# Patient Record
Sex: Female | Born: 1960 | Race: White | Hispanic: No | Marital: Married | Smoking: Never smoker
Health system: Southern US, Community
[De-identification: ages and names within clinical notes are randomized; demographics above are authoritative.]

## PROBLEM LIST (undated history)

## (undated) DIAGNOSIS — J45909 Unspecified asthma, uncomplicated: Secondary | ICD-10-CM

## (undated) DIAGNOSIS — T8859XA Other complications of anesthesia, initial encounter: Secondary | ICD-10-CM

## (undated) DIAGNOSIS — J189 Pneumonia, unspecified organism: Secondary | ICD-10-CM

## (undated) DIAGNOSIS — T4145XA Adverse effect of unspecified anesthetic, initial encounter: Secondary | ICD-10-CM

## (undated) DIAGNOSIS — E7800 Pure hypercholesterolemia, unspecified: Secondary | ICD-10-CM

## (undated) DIAGNOSIS — R112 Nausea with vomiting, unspecified: Secondary | ICD-10-CM

## (undated) DIAGNOSIS — Z77120 Contact with and (suspected) exposure to mold (toxic): Secondary | ICD-10-CM

## (undated) DIAGNOSIS — N809 Endometriosis, unspecified: Secondary | ICD-10-CM

## (undated) DIAGNOSIS — Z9889 Other specified postprocedural states: Secondary | ICD-10-CM

## (undated) DIAGNOSIS — M1990 Unspecified osteoarthritis, unspecified site: Secondary | ICD-10-CM

## (undated) HISTORY — PX: LAPAROSCOPIC ENDOMETRIOSIS FULGURATION: SUR769

## (undated) HISTORY — PX: CARPAL TUNNEL RELEASE: SHX101

## (undated) HISTORY — PX: APPENDECTOMY: SHX54

## (undated) HISTORY — PX: COLONOSCOPY: SHX174

---

## 1998-08-06 HISTORY — PX: ABDOMINAL HYSTERECTOMY: SHX81

## 2000-08-13 ENCOUNTER — Encounter: Payer: Self-pay | Admitting: Emergency Medicine

## 2000-08-13 ENCOUNTER — Emergency Department (HOSPITAL_COMMUNITY): Admission: EM | Admit: 2000-08-13 | Discharge: 2000-08-13 | Payer: Self-pay | Admitting: Emergency Medicine

## 2017-01-09 ENCOUNTER — Telehealth (INDEPENDENT_AMBULATORY_CARE_PROVIDER_SITE_OTHER): Payer: Self-pay | Admitting: Specialist

## 2017-01-09 NOTE — Telephone Encounter (Signed)
Patient called and asked if we would be able to pull up xrays taken in Oct 2017 at [facility]? CB # [id number]

## 2017-01-09 NOTE — Telephone Encounter (Signed)
I called and advised that we cannot get images or the reports due to them being read nby Dr in a clinic, however they are over 63 months old so we will probably get new ones at her office visit, due to needing additional images too.

## 2017-01-14 ENCOUNTER — Ambulatory Visit (INDEPENDENT_AMBULATORY_CARE_PROVIDER_SITE_OTHER): Payer: PRIVATE HEALTH INSURANCE | Admitting: Specialist

## 2017-01-14 ENCOUNTER — Ambulatory Visit (INDEPENDENT_AMBULATORY_CARE_PROVIDER_SITE_OTHER): Payer: PRIVATE HEALTH INSURANCE

## 2017-01-14 ENCOUNTER — Encounter (INDEPENDENT_AMBULATORY_CARE_PROVIDER_SITE_OTHER): Payer: Self-pay | Admitting: Specialist

## 2017-01-14 VITALS — BP 158/90 | HR 84 | Ht 65.0 in | Wt 138.0 lb

## 2017-01-14 DIAGNOSIS — M4722 Other spondylosis with radiculopathy, cervical region: Secondary | ICD-10-CM | POA: Diagnosis not present

## 2017-01-14 DIAGNOSIS — M5412 Radiculopathy, cervical region: Secondary | ICD-10-CM | POA: Diagnosis not present

## 2017-01-14 DIAGNOSIS — M542 Cervicalgia: Secondary | ICD-10-CM

## 2017-01-14 MED ORDER — GABAPENTIN 100 MG PO CAPS: 100.0000 mg | ORAL_CAPSULE | Freq: Every day | ORAL | 0 refills | Status: DC

## 2017-01-14 MED ORDER — DICLOFENAC POTASSIUM 50 MG PO TABS: 50.0000 mg | ORAL_TABLET | Freq: Two times a day (BID) | ORAL | 1 refills | Status: DC

## 2017-01-14 NOTE — Patient Instructions (Signed)
Avoid overhead lifting and overhead use of the arms. Do not lift greater than 5-10 lbs. Adjust head rest in vehicle to prevent hyperextension if rear ended. Hot showers or ice as needed. Try diclofenac one tablet a day or twice a day.

## 2017-01-14 NOTE — Progress Notes (Addendum)
Office Visit Note   Patient: Marie Richardson           Date of Birth: [birth year]           MRN: [id number] Visit Date: 01/14/2017              Requested by: No referring provider defined for this encounter. PCP: System, Pcp Not In   Assessment & Plan: Visit Diagnoses:  1. Cervicalgia   2. Other spondylosis with radiculopathy, cervical region   3. 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 She was seen by [facility]  In September 2017. Her clinical exam shows decreased ROM of the cervical spine with pain with flexion or extension. She has weakness in the right triceps 4/5 and right wrist volar flexors 4/5, intrinsic motor is intact. She complains of clumbsiness with the right hand and dropping of items. No balance or coordination difficulties, no bowel or bladder abnormalities.  Recommend for cervical MRI as the pattern of weakness and numbness is most consistent with C7 or C8  Radiculopathy. Her studies also show diffuse arthrosis changes of the facets of the cervical spine C2 through C6 and at the C1-2 level, no instability and the disc height is well maintained.   Plan: Avoid overhead lifting and overhead use of the arms. Do not lift greater than 5-10 lbs. Adjust head rest in vehicle to prevent hyperextension if rear ended. Hot showers or ice as needed. Try diclofenac one tablet a day or twice a day. MRI of the cervical spine is ordered. Try gabapentin for neurogenic pain at night. Diclofenac for ll Follow-Up Instructions: Return in about 3 weeks (around 02/04/2017).   Orders:  Orders Placed This Encounter  Procedures  . XR Cervical Spine 2 or 3 views  . MR Cervical Spine w/o contrast   Meds ordered this encounter    Medications  . gabapentin (NEURONTIN) 100 MG capsule    Sig: Take 1 capsule (100 mg total) by mouth at bedtime.    Dispense:  30 capsule    Refill:  0  . diclofenac (CATAFLAM) 50 MG tablet    Sig: Take 1 tablet (50 mg total) by mouth 2 (two) times daily.    Dispense:  60 tablet    Refill:  1      Procedures: No procedures performed   Clinical Data: No additional findings.   Subjective: Chief Complaint  Patient presents with  . Neck - Pain    56 year old right handed female with greater one year history of neck pain with pain between her shoulder blades. Pain began when shovelling snow in driveway in January, 2017. Pain with soreness originally, then it worsened over time and was seen by [facility]earing or [facility]. They thought she may wish to consider surgery but she is reluctant to consider having the surgery in [place]. Pain on scale of 1-10 is on the higher end. She has been taking ibuprofen, stopped to let the pain be present for exam today but had to take ibuprofen starting Sunday.  She takes 800 mg motrin daily, also has muscle relaxers. No problems with standing or walking. Has noticed dropping intems with the right greater than left. She is having some difficulty with hand writitng. At the end  of the day she notices tightness in the muscles of the hands. Both hands with numbness in the AM and also during the day and with driving. Awakes at night and not sleeping as well as she used to. Neck in the past was aching, intermittant and now it is more constant. Bending backwards or when driving more pain with ROM. With computer staring and having to use her phone. 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 Last 2 years has lost her exercise capacity and now is easily worn out. Has been 4 years at a part time work. 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    Review of Systems  Constitutional: Negative.   HENT: Negative.   Eyes: Negative.   Respiratory: Negative.   Cardiovascular: Negative.   Gastrointestinal: Negative.   Endocrine: Negative.   Genitourinary: Negative.   Musculoskeletal: Negative.   Skin: Negative.   Allergic/Immunologic: Negative.   Neurological: Negative.   Hematological: Negative.   Psychiatric/Behavioral: Negative.      Objective: Vital Signs: BP (!) 158/90 (BP Location: Left Arm, Patient Position: Sitting)   Pulse 84   Ht 5\' 5"  (1.651 m)   Wt 138 lb (62.6 kg)   BMI 22.96 kg/m   Physical Exam  Constitutional: She is oriented to person, place, and time. She appears well-developed and well-nourished.  HENT:  Head: Normocephalic and atraumatic.  Eyes: EOM are normal. Pupils are equal, round, and reactive to light.  Neck: Normal range of motion. Neck supple.  Pulmonary/Chest: Effort normal and breath sounds normal.  Abdominal: Soft. Bowel sounds are normal.  Neurological: She is alert and oriented to person, place, and time.  Skin: Skin is warm and dry.  Psychiatric: She has a normal mood and affect. Her behavior is normal. Judgment and thought content normal.    Back Exam   Tenderness  The patient is experiencing tenderness in the cervical.  Range of Motion  Extension: 80  Flexion: 90  Lateral Bend Right:  80 abnormal  Lateral Bend Left:  80 abnormal  Rotation Right: 80  Rotation Left: 80   Muscle Strength  Right Quadriceps:  4/5  Left Quadriceps:  4/5  Right Hamstrings:  4/5  Left Hamstrings:  4/5   Other  Toe Walk: normal Heel Walk: normal Gait: normal       Specialty Comments:  No specialty comments available.  Imaging: Xr Cervical Spine 2 Or 3 Views  Result Date: 01/14/2017 AP and lateral flexion and extension of the cervical spine with disc height well maintained, there is spondylosis of the cervical segments C2-3 through C6, mild DJD at the  C1-2    PMFS History: There are no active problems to display for this patient.  No past medical history on file.  No family history on file.  No past surgical history on file. Social History   Occupational History  . Not on file.   Social History Main Topics  . Smoking status: Never Smoker  . Smokeless tobacco: Never Used  . Alcohol use Yes  . Drug use: No  . Sexual activity: Not on file

## 2017-01-24 ENCOUNTER — Telehealth (INDEPENDENT_AMBULATORY_CARE_PROVIDER_SITE_OTHER): Payer: Self-pay | Admitting: Specialist

## 2017-01-24 NOTE — Telephone Encounter (Signed)
PT ASKED IF WE CAN SEND IN SOMETHING PRIOR  TO MRI SAT FOR CLAUSTROPHOBIA PLEASE.  [phone]

## 2017-01-24 NOTE — Telephone Encounter (Signed)
PT ASKED IF WE CAN SEND IN SOMETHING PRIOR  TO MRI SAT FOR CLAUSTROPHOBIA PLEASE

## 2017-01-25 ENCOUNTER — Telehealth (INDEPENDENT_AMBULATORY_CARE_PROVIDER_SITE_OTHER): Payer: Self-pay | Admitting: Orthopedic Surgery

## 2017-01-25 MED ORDER — DIAZEPAM 5 MG PO TABS: ORAL_TABLET | ORAL | 0 refills | Status: DC

## 2017-01-25 NOTE — Telephone Encounter (Signed)
I called in #2 valium 5mg  to pharmacy. Patients MRI is Saturday 06/23 She will have driver

## 2017-01-25 NOTE — Telephone Encounter (Signed)
PT ASKED IF WE CAN SEND IN SOMETHING PRIOR TO MRI SAT FOR CLAUSTROPHOBIA PLEASE

## 2017-01-26 ENCOUNTER — Ambulatory Visit
Admission: RE | Admit: 2017-01-26 | Discharge: 2017-01-26 | Disposition: A | Discharge status: Home / Self Care | Payer: Self-pay | Source: Ambulatory Visit | Attending: Specialist | Admitting: Specialist

## 2017-01-26 DIAGNOSIS — M5412 Radiculopathy, cervical region: Secondary | ICD-10-CM

## 2017-01-26 DIAGNOSIS — M4722 Other spondylosis with radiculopathy, cervical region: Secondary | ICD-10-CM

## 2017-01-28 ENCOUNTER — Telehealth (INDEPENDENT_AMBULATORY_CARE_PROVIDER_SITE_OTHER): Payer: Self-pay | Admitting: Radiology

## 2017-01-28 NOTE — Telephone Encounter (Signed)
Patient called in stated is having increased pain since MRI, wanted to be worked in sooner with Dr. Louanne Skye.  She did not want to see another physician at this time.  Pain is tolerable, does not want narcotics.  Patient wants advise if there is any other medication help with pain until scheduled appointment with Dr. Louanne Skye.

## 2017-01-28 NOTE — Telephone Encounter (Signed)
Patient called in stated is having increased pain since MRI, wanted to be worked in sooner with Dr. Louanne Skye.  She did not want to see another physician at this time.  Pain is tolerable, does not want narcotics.  Patient wants advise if there is any other medication help with pain until scheduled appointment with Dr. Marion Downer advise and I will call her

## 2017-01-28 NOTE — Telephone Encounter (Signed)
ok 

## 2017-01-30 NOTE — Progress Notes (Signed)
Patient sched for 7// to review scan

## 2017-01-30 NOTE — Telephone Encounter (Signed)
I called Marie Richardson and spoke with her, she is still experiencing discomfort in her neck, gabapentin helps some at night. I told her to increase the diclofenac to 50 mg TID from the BID and the gabapentin to 200mg  at night. She has an appointment for Monday 7/2 for follow up. I told her the results of the MRI.

## 2017-02-04 ENCOUNTER — Ambulatory Visit (INDEPENDENT_AMBULATORY_CARE_PROVIDER_SITE_OTHER): Payer: PRIVATE HEALTH INSURANCE | Admitting: Specialist

## 2017-02-04 ENCOUNTER — Encounter (INDEPENDENT_AMBULATORY_CARE_PROVIDER_SITE_OTHER): Payer: Self-pay | Admitting: Specialist

## 2017-02-04 VITALS — BP 145/87 | HR 90 | Ht 65.0 in | Wt 138.0 lb

## 2017-02-04 DIAGNOSIS — M4722 Other spondylosis with radiculopathy, cervical region: Secondary | ICD-10-CM | POA: Diagnosis not present

## 2017-02-04 MED ORDER — TRAMADOL-ACETAMINOPHEN 37.5-325 MG PO TABS: 1.0000 | ORAL_TABLET | Freq: Four times a day (QID) | ORAL | 0 refills | Status: DC | PRN

## 2017-02-04 NOTE — Progress Notes (Addendum)
Office Visit Note   Patient: Marie Richardson           Date of Birth: [birth year]           MRN: [id number] Visit Date: 02/04/2017              Requested by: No referring provider defined for this encounter. PCP: System, Pcp Not In   Assessment & Plan: Visit Diagnoses:  1. Other spondylosis with radiculopathy, cervical region   56 year old right handed female with persistent radicular pain into the right shoulder and right forearm for over one year post digging out of  [place]. Her pain is consistent with a cervical radiculopathy with limitation of ROM of the cervical spine and plain radiographs and cervical MRI  Consistent with cervical spondylosis. Her motor deficit does not quite match her radicular pain or the C6 right sided findings on her MRI. I recommend A selective nerve block right C6 with lidocaine and no steroid to assess if the C6 root is the source of the radicular symptoms and potentially the cause for the triceps weakness if there is a pre or post fixed root level. Schedule the SNR block and return visit in 2 weeks to decide is surgical intervention with a right C5-6 foraminotomy with C6 nerve root decompression can be considered.    Plan: Avoid overhead lifting and overhead use of the arms. Do not lift greater than 5 lbs. Adjust head rest in vehicle to prevent hyperextension if rear ended. A right C6 nerve root block is ordered to establish this as the pain generator in your neck as you have findings of arthritis in most of your  Cervical spine, C3-4, C4-5, C5-6 and C6-7.   Follow-Up Instructions: Return in about 2 weeks (around 02/18/2017).   Orders:  Orders Placed This Encounter  Procedures  . Ambulatory referral to Interventional Radiology   Meds ordered this encounter  Medications  . traMADol-acetaminophen (ULTRACET) 37.5-325 MG tablet    Sig: Take 1 tablet by mouth every 6 (six) hours as needed.    Dispense:  30 tablet    Refill:  0      Procedures: No  procedures performed   Clinical Data: Findings:  MRI with facet arthrosis changes C3-4, C4-5, C5-6 and C6-7. More significant on the left side at C3-4 and C4-5. 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 Minimal anterolisthesis C5-6.     Subjective: Chief Complaint  Patient presents with  . Neck - Follow-up    MRI Review----CSP    56 year old female with a greater than one year history of neck pain and radiation into the right shoulder, right lateral arm and dorsolateral forearm. She experiences pain and burning qualities as well as numbness and paresthesias into the right arm and forearm. Seen and evaluated found to have weakness in the right triceps and radiographic signs of spondylosis of the cervical spine, discs are well maintained. Underwent recent MRI which demonstrates right sided changes at the C5-6 level with severe spondylosis of the right C5-6 facet and severe foramenal narrowing of the right C5-6 foramen due to spondylosis of the facet and mild uncovertebral changes. No gait disturbance, no bowel or bladder difficulties. She reports that she is ready to have something done to stop the pain.     Review of Systems  Constitutional: Negative.   HENT: Negative.   Eyes: Negative.   Respiratory: Negative.  Cardiovascular: Negative.   Gastrointestinal: Negative.   Endocrine: Negative.   Genitourinary: Negative.   Musculoskeletal: Negative.   Skin: Negative.   Allergic/Immunologic: Negative.   Neurological: Negative.   Hematological: Negative.   Psychiatric/Behavioral: Negative.      Objective: Vital Signs: BP (!) 145/87 (BP Location: Left Arm, Patient Position: Sitting)   Pulse 90   Ht 5\' 5"  (1.651 m)   Wt 138 lb (62.6 kg)   BMI 22.96 kg/m   Physical Exam  Constitutional: She is oriented to person, place, and time. She appears well-developed and well-nourished.  HENT:  Head:  Normocephalic and atraumatic.  Eyes: EOM are normal. Pupils are equal, round, and reactive to light.  Neck: Normal range of motion. Neck supple.  Pulmonary/Chest: Effort normal and breath sounds normal.  Abdominal: Soft. Bowel sounds are normal.  Neurological: She is alert and oriented to person, place, and time.  Skin: Skin is warm and dry.  Psychiatric: She has a normal mood and affect. Her behavior is normal. Judgment and thought content normal.    Back Exam   Tenderness  The patient is experiencing tenderness in the cervical.  Range of Motion  Extension:  60 abnormal  Flexion: 70  Lateral Bend Right: 60  Lateral Bend Left: 70  Rotation Right: 60  Rotation Left: 70   Muscle Strength  Right Quadriceps:  5/5  Left Quadriceps:  5/5  Right Hamstrings:  5/5  Left Hamstrings:  5/5   Tests  Straight leg raise right: negative Straight leg raise left: negative  Reflexes  Patellar: normal Achilles: normal Biceps: abnormal Babinski's sign: normal   Other  Toe Walk: normal Heel Walk: normal Gait: normal   Comments:  Weakness right triceps 4/5, shoulder ROM is normal.       Specialty Comments:  No specialty comments available.  Imaging: No results found.   PMFS History: There are no active problems to display for this patient.  No past medical history on file.  No family history on file.  No past surgical history on file. Social History   Occupational History  . Not on file.   Social History Main Topics  . Smoking status: Never Smoker  . Smokeless tobacco: Never Used  . Alcohol use Yes  . Drug use: No  . Sexual activity: Not on file

## 2017-02-04 NOTE — Addendum Note (Signed)
Addended by: Basil Dess on: 02/04/2017 04:34 PM   Modules accepted: Orders

## 2017-02-04 NOTE — Patient Instructions (Signed)
Plan: Avoid overhead lifting and overhead use of the arms. Do not lift greater than 5 lbs. Adjust head rest in vehicle to prevent hyperextension if rear ended. A right C6 nerve root block is ordered to establish this as the pain generator in your neck as you have findings of arthritis in most of your  Cervical spine, C3-4, C4-5, C5-6 and C6-7.

## 2017-02-05 ENCOUNTER — Telehealth (INDEPENDENT_AMBULATORY_CARE_PROVIDER_SITE_OTHER): Payer: Self-pay

## 2017-02-05 NOTE — Telephone Encounter (Signed)
Patient called stating she saw Dr Louanne Skye yesterday and he wanted to refer her to Dr Vernard Gambles to have nerve block. She had not heard anything so she called his office and they advised her that he does not do these. She stated she needs to be sent somewhere else to have this done. I advised would likely have to discuss with Dr Louanne Skye to see if he knows of somewhere to refer. She is anxious to have this done because she is leaving for vacation on Saturday. Please call her to discuss [phone]

## 2017-02-08 NOTE — Telephone Encounter (Signed)
Can you check on this for me? He was going to send her to [facility] to have injection done.

## 2017-02-11 NOTE — Telephone Encounter (Signed)
I called and left message with Anderson Malta at [facility] advising of order to see if can do. IF can she will contact pt to schedule appt

## 2017-02-21 ENCOUNTER — Ambulatory Visit (INDEPENDENT_AMBULATORY_CARE_PROVIDER_SITE_OTHER): Payer: PRIVATE HEALTH INSURANCE | Admitting: Specialist

## 2017-03-06 ENCOUNTER — Encounter (INDEPENDENT_AMBULATORY_CARE_PROVIDER_SITE_OTHER): Payer: Self-pay | Admitting: Physical Medicine and Rehabilitation

## 2017-03-06 ENCOUNTER — Other Ambulatory Visit (INDEPENDENT_AMBULATORY_CARE_PROVIDER_SITE_OTHER): Payer: Self-pay | Admitting: Radiology

## 2017-03-06 ENCOUNTER — Ambulatory Visit (INDEPENDENT_AMBULATORY_CARE_PROVIDER_SITE_OTHER): Payer: Self-pay

## 2017-03-06 ENCOUNTER — Ambulatory Visit (INDEPENDENT_AMBULATORY_CARE_PROVIDER_SITE_OTHER): Payer: PRIVATE HEALTH INSURANCE | Admitting: Physical Medicine and Rehabilitation

## 2017-03-06 VITALS — BP 183/93 | HR 79

## 2017-03-06 DIAGNOSIS — M5412 Radiculopathy, cervical region: Secondary | ICD-10-CM | POA: Diagnosis not present

## 2017-03-06 DIAGNOSIS — M4722 Other spondylosis with radiculopathy, cervical region: Secondary | ICD-10-CM

## 2017-03-06 MED ORDER — LIDOCAINE HCL (PF) 1 % IJ SOLN: 2.0000 mL | Freq: Once | INTRAMUSCULAR | Status: DC

## 2017-03-06 NOTE — Progress Notes (Deleted)
Neck and right shoulder pain for over 2 years. Gotten worse over time. Seems to be worse at the end of the day once she relaxes. Numbness and weakness in arm along with tightness at times.

## 2017-03-06 NOTE — Telephone Encounter (Signed)
Patient states that she is not resting with the lyrica and she would like to go back on the gabapentin as she feels like she did get more rest on that.

## 2017-03-06 NOTE — Progress Notes (Unsigned)
Fluoro Time: 48 sec MGy: 9.09

## 2017-03-06 NOTE — Patient Instructions (Signed)

## 2017-03-07 NOTE — Procedures (Signed)
Mrs. 2 word is a 56 year old right-hand dominant female with right neck shoulder and arm pain with noted numbness and weakness. She also feels like her arm gets tight at times. She seems to be worse at the end of the day once she actually relaxes it seems to get worse. Has been ongoing for 2 years of failing conservative care otherwise. Dr. Louanne Skye did obtain an MRI scan showing significant facet arthritis and some foraminal narrowing at C5-6. We are going to complete a diagnostic C6 selective nerve root block. Cervical Transforaminal Selective Nerve Root Block with Fluoroscopic Guidance   Patient: Marie Richardson      Date of Birth: 1960-09-22 MRN: [id number] PCP: System, Pcp Not In      Visit Date: 03/06/2017   Universal Protocol:    Date/Time: 03/07/1810:20 AM  Consent Given By: the patient  Position: supine  Additional Comments: Vital signs were monitored before and after the procedure. Patient was prepped and draped in the usual sterile fashion. The correct patient, procedure, and site was verified.   Injection Procedure Details:  Procedure Site One Meds Administered:  Meds ordered this encounter  Medications  . lidocaine (PF) (XYLOCAINE) 1 % injection 2 mL     Laterality: Right  Location/Site:  C5-6  Needle size: 25 G  Needle type: spinal needle  Needle Placement: under the pedicle  Findings:  -Contrast Used: 1 mL iohexol 180 mg iodine/mL   -Comments: Excellent flow of contrast along the nerve and nerve root without intravascular flow. *Patient did seem to have some initial pain relief of the neck and shoulder pain but the arm pain was still present.  Procedure Details: The C-arm was obliqued to the ipsilateral side of the patient to about 45 from AP.  The C-arm was then tilted to square off the inferior endplate of the level targeted.  C-arm obliquity was then adjusted to maximize the size of the foramen.  The skin over the targeted area which was the posterior  inferior portion of the foramen was then anesthetized with a small amount of 1% lidocaine without epinephrine.  A 25-gauge 2-1/2 inch spinal needle was then introduced through the skin and down to the targeted area under biplanar fluoroscopic guidance.  Multiple AP and lateral/oblique views were monitored during needle manipulation.  Final placement was with the needle just outside of the 6 o'clock position on the AP view.  After negative aspirate for air, blood, and CSF, a 1 ml. volume of Isovue-250 was injected into the foramen and the flow of contrast was observed. Radiographs were obtained for documentation purposes.   When there was no arterial or venous flow of contrast, the injectate was administered into the level noted above.     Additional Comments:  The patient tolerated the procedure well Dressing: Band-Aid    Post-procedure details: Patient was observed during the procedure. Post-procedure instructions were reviewed.  Patient left the clinic in stable condition.

## 2017-03-08 ENCOUNTER — Other Ambulatory Visit (INDEPENDENT_AMBULATORY_CARE_PROVIDER_SITE_OTHER): Payer: Self-pay | Admitting: Specialist

## 2017-03-08 DIAGNOSIS — M5412 Radiculopathy, cervical region: Secondary | ICD-10-CM

## 2017-03-08 MED ORDER — TRAMADOL-ACETAMINOPHEN 37.5-325 MG PO TABS: 1.0000 | ORAL_TABLET | Freq: Four times a day (QID) | ORAL | 0 refills | Status: DC | PRN

## 2017-03-08 NOTE — Telephone Encounter (Signed)
Patient called and stated that she also requested the Tramadol for the pain. Thank you.

## 2017-03-11 ENCOUNTER — Encounter (INDEPENDENT_AMBULATORY_CARE_PROVIDER_SITE_OTHER): Payer: PRIVATE HEALTH INSURANCE | Admitting: Physical Medicine and Rehabilitation

## 2017-03-11 NOTE — Telephone Encounter (Signed)
gabapentin refill request 

## 2017-03-11 NOTE — Telephone Encounter (Signed)
Called to [facility] to advise patient

## 2017-03-12 ENCOUNTER — Ambulatory Visit (INDEPENDENT_AMBULATORY_CARE_PROVIDER_SITE_OTHER): Payer: PRIVATE HEALTH INSURANCE | Admitting: Specialist

## 2017-03-12 ENCOUNTER — Encounter (INDEPENDENT_AMBULATORY_CARE_PROVIDER_SITE_OTHER): Payer: Self-pay | Admitting: Specialist

## 2017-03-12 VITALS — BP 133/86 | HR 79 | Ht 65.0 in | Wt 138.0 lb

## 2017-03-12 DIAGNOSIS — M4722 Other spondylosis with radiculopathy, cervical region: Secondary | ICD-10-CM | POA: Diagnosis not present

## 2017-03-12 DIAGNOSIS — M5412 Radiculopathy, cervical region: Secondary | ICD-10-CM | POA: Diagnosis not present

## 2017-03-12 NOTE — Patient Instructions (Addendum)
Avoid overhead lifting and overhead use of the arms. Do not lift greater than 5 lbs. Adjust head rest in vehicle to prevent hyperextension if rear ended. Take extra precautions to avoid falling, including use of a cane if you feel weak. Scheduling secretary Kandice Hams will call you to arrange for surgery for your cervical spine. If you wish a second opinion please let us know and we can arrange for you. If you have worsening arm or leg numbness or weakness please call or go to an ER.  Surgery will be a posterior cervical foramenotomies at the C5-6 and C6-7 levels with Risks of surgery include risks of infection, bleeding and risks to the spinal cord and  Surgery is indicated due to upper extremity radiculopathy. In the future surgery at adjacent levels may be necessary but these levels do not appear to be related to your current symptoms or signs.

## 2017-03-12 NOTE — Progress Notes (Addendum)
Office Visit Note   Patient: Marie Richardson           Date of Birth: 26-Sep-1960           MRN: [id number] Visit Date: 03/12/2017              Requested by: No referring provider defined for this encounter. PCP: System, Pcp Not In   Assessment & Plan: Visit Diagnoses:  1. Other spondylosis with radiculopathy, cervical region   56 year old right handed female, nearly 2 year history of neck pain and radiation into the right arm. Studies with spondylosis changes right C3-4, C4-5, C5-6 and C6-7. Recent C6 selective nerve root block with relief of neck and right shoulder pain for 2-3 hours. "I told my husband to get home  Quick because my neck felt soo good I wanted to get some things done. The medication wore off and pain has recurred. Also had some persistent pain into the top of the right hand that was not improved with the C6 block. She has tried conservative treatment, her weakness in the right hand is  C7 distirbution, finger extension and wrist volar flexion. I recommend a right sided C5-6 and C6-7 foramenotomies based on clincal finding of a C7 Weakness and pain relief in the arm and neck with C6 block. Only other consideration would be fusion surgery which I do not recommend as she Would require a potentially 4 level fusion for degenerative changes. Expect this would relieve her arm pain but likely she will have some persistent Right sided neck pain. The surgery does not remove arthritis but relieves nerve compression. She understands and wishes to proceed.  Expect a minimum of 2 weeks to recover to where she may consider return to work but potentially as long as 6 weeks for muscle recovery.Return to work dependent on levels of discomfort.    Plan:Avoid overhead lifting and overhead use of the arms. Do not lift greater than 5 lbs. Adjust head rest in vehicle to prevent hyperextension if rear ended. Take extra precautions to avoid falling, including use of a cane if you feel  weak. Scheduling secretary Kandice Hams will call you to arrange for surgery for your cervical spine. If you wish a second opinion please let us know and we can arrange for you. If you have worsening arm or leg numbness or weakness please call or go to an ER.  Surgery will be a posterior cervical foramenotomies at the C5-6 and C6-7 levels with Risks of surgery include risks of infection, bleeding and risks to the spinal cord and  Surgery is indicated due to upper extremity radiculopathy. In the future surgery at adjacent levels may be necessary but these levels do not appear to be related to your current symptoms or signs.   Follow-Up Instructions: Return in about 4 weeks (around 04/09/2017) for post op appointment.   Orders:  No orders of the defined types were placed in this encounter.  No orders of the defined types were placed in this encounter.     Procedures: No procedures performed   Clinical Data: No additional findings.   Subjective: Chief Complaint  Patient presents with  . Neck - Follow-up    56 year old right handed female with 18 month history of neck pain and radiation into the right arm and right hand. Pain worse with extension and rotation of the neck. Has undergone PT and has seen no improvement with steroids or NSAIDs. Recent selective nerve root block right  C6 with improved right shoulder and right neck pain lasting only about 2-3 hours. No bowel or bladder difficulties, no gait disturbance. Still with decreased grip strength in the right hand, noted with inability to hold onto the lease while walking the dog.    Review of Systems  Constitutional: Negative.   HENT: Negative.   Eyes: Negative.   Respiratory: Negative.   Cardiovascular: Negative.   Gastrointestinal: Negative.   Endocrine: Negative.   Genitourinary: Negative.   Musculoskeletal: Negative.   Skin: Negative.   Allergic/Immunologic: Negative.   Neurological: Negative.   Hematological:  Negative.   Psychiatric/Behavioral: Negative.      Objective: Vital Signs: BP 133/86 (BP Location: Left Arm, Patient Position: Sitting, Cuff Size: Normal)   Pulse 79   Ht 5\' 5"  (1.651 m)   Wt 138 lb (62.6 kg)   BMI 22.96 kg/m   Physical Exam  Constitutional: She is oriented to person, place, and time. She appears well-developed and well-nourished.  HENT:  Head: Normocephalic and atraumatic.  Eyes: Pupils are equal, round, and reactive to light. EOM are normal.  Neck: Normal range of motion. Neck supple.  Pulmonary/Chest: Effort normal and breath sounds normal.  Abdominal: Soft. Bowel sounds are normal.  Neurological: She is alert and oriented to person, place, and time.  Skin: Skin is warm and dry.  Psychiatric: She has a normal mood and affect. Her behavior is normal. Judgment and thought content normal.    Back Exam   Tenderness  The patient is experiencing tenderness in the cervical.  Range of Motion  Extension: abnormal  Flexion: normal  Lateral Bend Right: abnormal  Lateral Bend Left: normal  Rotation Right: abnormal  Rotation Left: normal   Muscle Strength  Right Quadriceps:  5/5  Left Quadriceps:  5/5  Right Hamstrings:  5/5  Left Hamstrings:  5/5   Tests  Straight leg raise right: negative Straight leg raise left: negative  Reflexes  Patellar: 2/4 Achilles: 2/4 Biceps:  Hyporeflexic abnormal Babinski's sign: normal   Other  Toe Walk: normal Heel Walk: normal  Comments:  Positive spurling right side.      Specialty Comments:  No specialty comments available.  Imaging: No results found.   PMFS History: There are no active problems to display for this patient.  No past medical history on file.  No family history on file.  No past surgical history on file. Social History   Occupational History  . Not on file.   Social History Main Topics  . Smoking status: Never Smoker  . Smokeless tobacco: Never Used  . Alcohol use Yes  .  Drug use: No  . Sexual activity: Not on file

## 2017-03-22 ENCOUNTER — Encounter (HOSPITAL_COMMUNITY): Payer: Self-pay

## 2017-03-22 ENCOUNTER — Encounter (HOSPITAL_COMMUNITY)
Admission: RE | Admit: 2017-03-22 | Discharge: 2017-03-22 | Disposition: A | Discharge status: Home / Self Care | Payer: PRIVATE HEALTH INSURANCE | Source: Ambulatory Visit | Attending: Specialist | Admitting: Specialist

## 2017-03-22 DIAGNOSIS — Z01810 Encounter for preprocedural cardiovascular examination: Secondary | ICD-10-CM | POA: Insufficient documentation

## 2017-03-22 DIAGNOSIS — Z01812 Encounter for preprocedural laboratory examination: Secondary | ICD-10-CM | POA: Diagnosis present

## 2017-03-22 DIAGNOSIS — M4802 Spinal stenosis, cervical region: Secondary | ICD-10-CM | POA: Diagnosis not present

## 2017-03-22 DIAGNOSIS — M50222 Other cervical disc displacement at C5-C6 level: Secondary | ICD-10-CM | POA: Insufficient documentation

## 2017-03-22 HISTORY — DX: Endometriosis, unspecified: N80.9

## 2017-03-22 HISTORY — DX: Unspecified asthma, uncomplicated: J45.909

## 2017-03-22 HISTORY — DX: Other complications of anesthesia, initial encounter: T88.59XA

## 2017-03-22 HISTORY — DX: Contact with and (suspected) exposure to mold (toxic): Z77.120

## 2017-03-22 HISTORY — DX: Adverse effect of unspecified anesthetic, initial encounter: T41.45XA

## 2017-03-22 LAB — COMPREHENSIVE METABOLIC PANEL
ALBUMIN: 4.6 g/dL (ref 3.5–5.0)
ALT: 29 U/L (ref 14–54)
ANION GAP: 8 (ref 5–15)
AST: 26 U/L (ref 15–41)
Alkaline Phosphatase: 62 U/L (ref 38–126)
BUN: 7 mg/dL (ref 6–20)
CALCIUM: 9.6 mg/dL (ref 8.9–10.3)
CHLORIDE: 104 mmol/L (ref 101–111)
CO2: 27 mmol/L (ref 22–32)
Creatinine, Ser: 0.6 mg/dL (ref 0.44–1.00)
GFR calc non Af Amer: >60 mL/min (ref >60)
Glucose, Bld: 126 mg/dL — ABNORMAL HIGH (ref 65–99)
POTASSIUM: 3.9 mmol/L (ref 3.5–5.1)
SODIUM: 139 mmol/L (ref 135–145)
Total Bilirubin: 0.6 mg/dL (ref 0.3–1.2)
Total Protein: 7.5 g/dL (ref 6.5–8.1)

## 2017-03-22 LAB — CBC
HCT: 37.3 % (ref 36.0–46.0)
Hemoglobin: 12.7 g/dL (ref 12.0–15.0)
MCH: 31.8 pg (ref 26.0–34.0)
MCHC: 34 g/dL (ref 30.0–36.0)
MCV: 93.5 fL (ref 78.0–100.0)
PLATELETS: 318 10*3/uL (ref 150–400)
RBC: 3.99 MIL/uL (ref 3.87–5.11)
RDW: 11.9 % (ref 11.5–15.5)
WBC: 8.9 10*3/uL (ref 4.0–10.5)

## 2017-03-22 LAB — PROTIME-INR
INR: 0.91
Prothrombin Time: 12.3 seconds (ref 11.4–15.2)

## 2017-03-22 LAB — APTT: APTT: 27 s (ref 24–36)

## 2017-03-22 LAB — SURGICAL PCR SCREEN
MRSA, PCR: NEGATIVE
STAPHYLOCOCCUS AUREUS: NEGATIVE

## 2017-03-22 NOTE — Progress Notes (Addendum)
PCP: Dr. Luana Shu ([place])  Cardiologist: pt denies  EKG: pt denies past year  Stress test: pt denies ever  ECHO:pt denies ever  Cardiac Cath:pt denies ever  Chest x-ray: pt denies past year

## 2017-03-22 NOTE — Pre-Procedure Instructions (Signed)
Marie Richardson  03/22/2017      [facility] [place], [place] - [place], [id number], [address] [place] [address] Phone: [phone] Fax: [phone]    Your procedure is scheduled on March 26, 2017.  Report to [facility] Admitting at 800 AM.  Call this number if you have problems the morning of surgery:  [phone]   Remember:  Do not eat food or drink liquids after midnight.  Take these medicines the morning of surgery with A SIP OF WATER albuterol inhaler (if needed, bring inhaler with you), estradiol (estrace), gabapentin (neurontin), tramadol-acetaminophen (ultracet)-if needed for pain.   7 days prior to surgery STOP taking any diclofenac (cataflam) Aspirin, Aleve, Naproxen, Ibuprofen, Motrin, Advil, Goody's, BC's, all herbal medications, fish oil, and all vitamins   Do not wear jewelry, make-up or nail polish.  Do not wear lotions, powders, or perfumes, or deoderant.  Do not shave 48 hours prior to surgery.    Do not bring valuables to the hospital.  [facility] is not responsible for any belongings or valuables.  Contacts, dentures or bridgework may not be worn into surgery.  Leave your suitcase in the car.  After surgery it may be brought to your room.  For patients admitted to the hospital, discharge time will be determined by your treatment team.  Patients discharged the day of surgery will not be allowed to drive home.   Special instructions:   [facility]- Preparing For Surgery  Before surgery, you can play an important role. Because skin is not sterile, your skin needs to be as free of germs as possible. You can reduce the number of germs on your skin by washing with CHG (chlorahexidine gluconate) Soap before surgery.  CHG is an antiseptic cleaner which kills germs and bonds with the skin to continue killing germs even after washing.  Please do not use if you have an allergy to CHG or antibacterial soaps.  If your skin becomes reddened/irritated stop using the CHG.  Do not shave (including legs and underarms) for at least 48 hours prior to first CHG shower. It is OK to shave your face.  Please follow these instructions carefully.   1. Shower the NIGHT BEFORE SURGERY and the MORNING OF SURGERY with CHG.   2. If you chose to wash your hair, wash your hair first as usual with your normal shampoo.  3. After you shampoo, rinse your hair and body thoroughly to remove the shampoo.  4. Use CHG as you would any other liquid soap. You can apply CHG directly to the skin and wash gently with a scrungie or a clean washcloth.   5. Apply the CHG Soap to your body ONLY FROM THE NECK DOWN.  Do not use on open wounds or open sores. Avoid contact with your eyes, ears, mouth and genitals (private parts). Wash genitals (private parts) with your normal soap.  6. Wash thoroughly, paying special attention to the area where your surgery will be performed.  7. Thoroughly rinse your body with warm water from the neck down.  8. DO NOT shower/wash with your normal soap after using and rinsing off the CHG Soap.  9. Pat yourself dry with a CLEAN TOWEL.   10. Wear CLEAN PAJAMAS   11. Place CLEAN SHEETS on your bed the night of your first shower and DO NOT SLEEP WITH PETS.    Day of Surgery: Do not apply any deodorants/lotions. Please wear  clean clothes to the hospital/surgery center.     Please read over the  fact sheets that you were given.

## 2017-03-25 MED ORDER — CEFAZOLIN SODIUM-DEXTROSE 2-4 GM/100ML-% IV SOLN
2.0000 g | INTRAVENOUS | Status: AC
  Administered 2017-03-26: 2 g via INTRAVENOUS
  Filled 2017-03-25: qty 100

## 2017-03-26 ENCOUNTER — Encounter (HOSPITAL_COMMUNITY)
Admission: RE | Disposition: A | Discharge status: Home / Self Care | Payer: Self-pay | Source: Ambulatory Visit | Attending: Specialist

## 2017-03-26 ENCOUNTER — Encounter (HOSPITAL_COMMUNITY): Payer: Self-pay | Admitting: Urology

## 2017-03-26 ENCOUNTER — Ambulatory Visit (HOSPITAL_COMMUNITY): Payer: PRIVATE HEALTH INSURANCE

## 2017-03-26 ENCOUNTER — Ambulatory Visit (HOSPITAL_COMMUNITY): Payer: PRIVATE HEALTH INSURANCE | Admitting: Certified Registered Nurse Anesthetist

## 2017-03-26 ENCOUNTER — Observation Stay (HOSPITAL_COMMUNITY)
Admission: RE | Admit: 2017-03-26 | Discharge: 2017-03-27 | Disposition: A | Discharge status: Home / Self Care | Payer: PRIVATE HEALTH INSURANCE | Source: Ambulatory Visit | Attending: Specialist | Admitting: Specialist

## 2017-03-26 DIAGNOSIS — M4802 Spinal stenosis, cervical region: Secondary | ICD-10-CM | POA: Insufficient documentation

## 2017-03-26 DIAGNOSIS — Z9889 Other specified postprocedural states: Secondary | ICD-10-CM

## 2017-03-26 DIAGNOSIS — J45909 Unspecified asthma, uncomplicated: Secondary | ICD-10-CM | POA: Insufficient documentation

## 2017-03-26 DIAGNOSIS — Z419 Encounter for procedure for purposes other than remedying health state, unspecified: Secondary | ICD-10-CM

## 2017-03-26 DIAGNOSIS — M4722 Other spondylosis with radiculopathy, cervical region: Secondary | ICD-10-CM | POA: Diagnosis present

## 2017-03-26 DIAGNOSIS — M47812 Spondylosis without myelopathy or radiculopathy, cervical region: Principal | ICD-10-CM | POA: Insufficient documentation

## 2017-03-26 HISTORY — PX: POSTERIOR CERVICAL FUSION/FORAMINOTOMY: SHX5038

## 2017-03-26 LAB — GLUCOSE, CAPILLARY: Glucose-Capillary: 146 mg/dL — ABNORMAL HIGH (ref 65–99)

## 2017-03-26 SURGERY — POSTERIOR CERVICAL FUSION/FORAMINOTOMY LEVEL 2
Anesthesia: General

## 2017-03-26 MED ORDER — OXYCODONE HCL 5 MG/5ML PO SOLN: 5.0000 mg | Freq: Once | ORAL | Status: DC | PRN

## 2017-03-26 MED ORDER — CHLORHEXIDINE GLUCONATE 4 % EX LIQD: 60.0000 mL | Freq: Once | CUTANEOUS | Status: DC

## 2017-03-26 MED ORDER — BUPIVACAINE LIPOSOME 1.3 % IJ SUSP
20.0000 mL | INTRAMUSCULAR | Status: DC
  Filled 2017-03-26: qty 20

## 2017-03-26 MED ORDER — ONDANSETRON HCL 4 MG/2ML IJ SOLN
INTRAMUSCULAR | Status: DC | PRN
  Administered 2017-03-26: 4 mg via INTRAVENOUS

## 2017-03-26 MED ORDER — HYDROMORPHONE HCL 1 MG/ML IJ SOLN
INTRAMUSCULAR | Status: AC
  Administered 2017-03-26: 0.5 mg via INTRAVENOUS
  Filled 2017-03-26: qty 1

## 2017-03-26 MED ORDER — SODIUM CHLORIDE 0.9 % IV SOLN: 250.0000 mL | INTRAVENOUS | Status: DC

## 2017-03-26 MED ORDER — ROCURONIUM BROMIDE 10 MG/ML (PF) SYRINGE
PREFILLED_SYRINGE | INTRAVENOUS | Status: AC
  Filled 2017-03-26: qty 5

## 2017-03-26 MED ORDER — MIDAZOLAM HCL 2 MG/2ML IJ SOLN
INTRAMUSCULAR | Status: AC
  Filled 2017-03-26: qty 2

## 2017-03-26 MED ORDER — ACETAMINOPHEN 650 MG RE SUPP: 650.0000 mg | RECTAL | Status: DC | PRN

## 2017-03-26 MED ORDER — DICLOFENAC SODIUM 50 MG PO TBEC
50.0000 mg | DELAYED_RELEASE_TABLET | Freq: Two times a day (BID) | ORAL | Status: DC
  Administered 2017-03-26 – 2017-03-27 (×2): 50 mg via ORAL
  Filled 2017-03-26 (×2): qty 1

## 2017-03-26 MED ORDER — PROPOFOL 10 MG/ML IV BOLUS
INTRAVENOUS | Status: DC | PRN
  Administered 2017-03-26: 150 mg via INTRAVENOUS

## 2017-03-26 MED ORDER — BISACODYL 5 MG PO TBEC: 5.0000 mg | DELAYED_RELEASE_TABLET | Freq: Every day | ORAL | Status: DC | PRN

## 2017-03-26 MED ORDER — DOCUSATE SODIUM 100 MG PO CAPS
100.0000 mg | ORAL_CAPSULE | Freq: Two times a day (BID) | ORAL | Status: DC
  Administered 2017-03-26 – 2017-03-27 (×2): 100 mg via ORAL
  Filled 2017-03-26 (×2): qty 1

## 2017-03-26 MED ORDER — BUPIVACAINE-EPINEPHRINE 0.25% -1:200000 IJ SOLN
INTRAMUSCULAR | Status: DC | PRN
  Administered 2017-03-26: 2.5 mL

## 2017-03-26 MED ORDER — METHOCARBAMOL 1000 MG/10ML IJ SOLN
500.0000 mg | Freq: Four times a day (QID) | INTRAMUSCULAR | Status: DC | PRN
  Filled 2017-03-26: qty 5

## 2017-03-26 MED ORDER — PHENOL 1.4 % MT LIQD
1.0000 | OROMUCOSAL | Status: DC | PRN
  Administered 2017-03-26: 1 via OROMUCOSAL
  Filled 2017-03-26: qty 177

## 2017-03-26 MED ORDER — PROPOFOL 10 MG/ML IV BOLUS
INTRAVENOUS | Status: AC
  Filled 2017-03-26: qty 20

## 2017-03-26 MED ORDER — OXYCODONE HCL 5 MG PO TABS: 5.0000 mg | ORAL_TABLET | Freq: Once | ORAL | Status: DC | PRN

## 2017-03-26 MED ORDER — ALUM & MAG HYDROXIDE-SIMETH 200-200-20 MG/5ML PO SUSP: 30.0000 mL | Freq: Four times a day (QID) | ORAL | Status: DC | PRN

## 2017-03-26 MED ORDER — LIDOCAINE 2% (20 MG/ML) 5 ML SYRINGE
INTRAMUSCULAR | Status: AC
  Filled 2017-03-26: qty 5

## 2017-03-26 MED ORDER — MORPHINE SULFATE (PF) 4 MG/ML IV SOLN
1.0000 mg | INTRAVENOUS | Status: DC | PRN
Start: 2017-03-26 — End: 2017-03-27
  Administered 2017-03-26: 1 mg via INTRAVENOUS
  Filled 2017-03-26: qty 1

## 2017-03-26 MED ORDER — MENTHOL 3 MG MT LOZG: 1.0000 | LOZENGE | OROMUCOSAL | Status: DC | PRN

## 2017-03-26 MED ORDER — GABAPENTIN 100 MG PO CAPS
100.0000 mg | ORAL_CAPSULE | Freq: Every day | ORAL | Status: DC
  Administered 2017-03-26: 100 mg via ORAL
  Filled 2017-03-26: qty 1

## 2017-03-26 MED ORDER — 0.9 % SODIUM CHLORIDE (POUR BTL) OPTIME
TOPICAL | Status: DC | PRN
  Administered 2017-03-26: 1000 mL

## 2017-03-26 MED ORDER — ALBUTEROL SULFATE (2.5 MG/3ML) 0.083% IN NEBU: 3.0000 mL | INHALATION_SOLUTION | Freq: Four times a day (QID) | RESPIRATORY_TRACT | Status: DC | PRN

## 2017-03-26 MED ORDER — ONDANSETRON HCL 4 MG PO TABS
4.0000 mg | ORAL_TABLET | Freq: Four times a day (QID) | ORAL | Status: DC | PRN
  Administered 2017-03-27: 4 mg via ORAL
  Filled 2017-03-26: qty 1

## 2017-03-26 MED ORDER — FENTANYL CITRATE (PF) 250 MCG/5ML IJ SOLN
INTRAMUSCULAR | Status: AC
  Filled 2017-03-26: qty 5

## 2017-03-26 MED ORDER — SODIUM CHLORIDE 0.9% FLUSH: 3.0000 mL | Freq: Two times a day (BID) | INTRAVENOUS | Status: DC

## 2017-03-26 MED ORDER — EPHEDRINE SULFATE 50 MG/ML IJ SOLN
INTRAMUSCULAR | Status: DC | PRN
  Administered 2017-03-26 (×2): 10 mg via INTRAVENOUS

## 2017-03-26 MED ORDER — ONDANSETRON HCL 4 MG/2ML IJ SOLN
4.0000 mg | Freq: Four times a day (QID) | INTRAMUSCULAR | Status: DC | PRN
  Administered 2017-03-26: 4 mg via INTRAVENOUS
  Filled 2017-03-26: qty 2

## 2017-03-26 MED ORDER — PANTOPRAZOLE SODIUM 40 MG IV SOLR
40.0000 mg | Freq: Every day | INTRAVENOUS | Status: DC
  Administered 2017-03-26: 40 mg via INTRAVENOUS
  Filled 2017-03-26: qty 40

## 2017-03-26 MED ORDER — SODIUM CHLORIDE 0.9% FLUSH: 3.0000 mL | INTRAVENOUS | Status: DC | PRN

## 2017-03-26 MED ORDER — PHENYLEPHRINE 40 MCG/ML (10ML) SYRINGE FOR IV PUSH (FOR BLOOD PRESSURE SUPPORT)
PREFILLED_SYRINGE | INTRAVENOUS | Status: AC
  Filled 2017-03-26: qty 10

## 2017-03-26 MED ORDER — BUPIVACAINE LIPOSOME 1.3 % IJ SUSP
INTRAMUSCULAR | Status: DC | PRN
  Administered 2017-03-26: 2.5 mL

## 2017-03-26 MED ORDER — BUPIVACAINE-EPINEPHRINE (PF) 0.25% -1:200000 IJ SOLN
INTRAMUSCULAR | Status: AC
  Filled 2017-03-26: qty 30

## 2017-03-26 MED ORDER — METHOCARBAMOL 500 MG PO TABS
500.0000 mg | ORAL_TABLET | Freq: Four times a day (QID) | ORAL | Status: DC | PRN
  Administered 2017-03-26 – 2017-03-27 (×3): 500 mg via ORAL
  Filled 2017-03-26 (×3): qty 1

## 2017-03-26 MED ORDER — THROMBIN 20000 UNITS EX SOLR
CUTANEOUS | Status: AC
  Filled 2017-03-26: qty 20000

## 2017-03-26 MED ORDER — ONDANSETRON HCL 4 MG/2ML IJ SOLN
INTRAMUSCULAR | Status: AC
  Filled 2017-03-26: qty 2

## 2017-03-26 MED ORDER — THROMBIN 20000 UNITS EX KIT
PACK | CUTANEOUS | Status: DC | PRN
  Administered 2017-03-26: 20000 [IU] via TOPICAL

## 2017-03-26 MED ORDER — ZOLPIDEM TARTRATE 5 MG PO TABS
5.0000 mg | ORAL_TABLET | Freq: Every day | ORAL | Status: DC
  Administered 2017-03-26: 5 mg via ORAL
  Filled 2017-03-26: qty 1

## 2017-03-26 MED ORDER — MIDAZOLAM HCL 5 MG/5ML IJ SOLN
INTRAMUSCULAR | Status: DC | PRN
  Administered 2017-03-26: 2 mg via INTRAVENOUS

## 2017-03-26 MED ORDER — LACTATED RINGERS IV SOLN
INTRAVENOUS | Status: DC
  Administered 2017-03-26 (×3): via INTRAVENOUS

## 2017-03-26 MED ORDER — TRAMADOL-ACETAMINOPHEN 37.5-325 MG PO TABS: 1.0000 | ORAL_TABLET | Freq: Four times a day (QID) | ORAL | Status: DC | PRN

## 2017-03-26 MED ORDER — PHENYLEPHRINE HCL 10 MG/ML IJ SOLN
INTRAMUSCULAR | Status: DC | PRN
  Administered 2017-03-26: 40 ug via INTRAVENOUS
  Administered 2017-03-26: 80 ug via INTRAVENOUS
  Administered 2017-03-26: 40 ug via INTRAVENOUS

## 2017-03-26 MED ORDER — FENTANYL CITRATE (PF) 100 MCG/2ML IJ SOLN
INTRAMUSCULAR | Status: DC | PRN
  Administered 2017-03-26 (×2): 50 ug via INTRAVENOUS

## 2017-03-26 MED ORDER — SUGAMMADEX SODIUM 200 MG/2ML IV SOLN
INTRAVENOUS | Status: AC
  Filled 2017-03-26: qty 2

## 2017-03-26 MED ORDER — HYDROMORPHONE HCL 1 MG/ML IJ SOLN
0.2500 mg | INTRAMUSCULAR | Status: DC | PRN
  Administered 2017-03-26 (×2): 0.5 mg via INTRAVENOUS

## 2017-03-26 MED ORDER — POLYETHYLENE GLYCOL 3350 17 G PO PACK: 17.0000 g | PACK | Freq: Every day | ORAL | Status: DC | PRN

## 2017-03-26 MED ORDER — FLEET ENEMA 7-19 GM/118ML RE ENEM: 1.0000 | ENEMA | Freq: Once | RECTAL | Status: DC | PRN

## 2017-03-26 MED ORDER — DEXAMETHASONE SODIUM PHOSPHATE 4 MG/ML IJ SOLN
INTRAMUSCULAR | Status: DC | PRN
  Administered 2017-03-26: 4 mg via INTRAVENOUS

## 2017-03-26 MED ORDER — ROCURONIUM BROMIDE 100 MG/10ML IV SOLN
INTRAVENOUS | Status: DC | PRN
  Administered 2017-03-26 (×2): 10 mg via INTRAVENOUS
  Administered 2017-03-26: 50 mg via INTRAVENOUS

## 2017-03-26 MED ORDER — CEFAZOLIN SODIUM-DEXTROSE 1-4 GM/50ML-% IV SOLN
1.0000 g | Freq: Three times a day (TID) | INTRAVENOUS | Status: AC
  Administered 2017-03-26 – 2017-03-27 (×2): 1 g via INTRAVENOUS
  Filled 2017-03-26 (×2): qty 50

## 2017-03-26 MED ORDER — HYDROCODONE-ACETAMINOPHEN 5-325 MG PO TABS
1.0000 | ORAL_TABLET | ORAL | Status: DC | PRN
Start: 2017-03-26 — End: 2017-03-27
  Administered 2017-03-26 – 2017-03-27 (×4): 2 via ORAL
  Filled 2017-03-26 (×4): qty 2

## 2017-03-26 MED ORDER — ACETAMINOPHEN 325 MG PO TABS: 650.0000 mg | ORAL_TABLET | ORAL | Status: DC | PRN

## 2017-03-26 MED ORDER — ESTRADIOL 2 MG PO TABS
2.0000 mg | ORAL_TABLET | Freq: Every day | ORAL | Status: DC
  Administered 2017-03-26: 2 mg via ORAL
  Filled 2017-03-26: qty 1

## 2017-03-26 MED ORDER — DIPHENHYDRAMINE HCL 25 MG PO CAPS: 25.0000 mg | ORAL_CAPSULE | Freq: Every evening | ORAL | Status: DC | PRN

## 2017-03-26 MED ORDER — SUGAMMADEX SODIUM 500 MG/5ML IV SOLN
INTRAVENOUS | Status: AC
  Filled 2017-03-26: qty 5

## 2017-03-26 MED ORDER — SUGAMMADEX SODIUM 200 MG/2ML IV SOLN
INTRAVENOUS | Status: DC | PRN
  Administered 2017-03-26: 150 mg via INTRAVENOUS

## 2017-03-26 MED ORDER — HYDROXYZINE HCL 50 MG/ML IM SOLN
50.0000 mg | Freq: Four times a day (QID) | INTRAMUSCULAR | Status: DC | PRN
  Administered 2017-03-26: 50 mg via INTRAMUSCULAR
  Filled 2017-03-26: qty 1

## 2017-03-26 SURGICAL SUPPLY — 57 items
ADH SKN CLS APL DERMABOND .7 (GAUZE/BANDAGES/DRESSINGS) ×1
BIT DRILL NEURO 2X3.1 SFT TUCH (MISCELLANEOUS) ×1 IMPLANT
BLADE CLIPPER SURG (BLADE) IMPLANT
BUR RND FLUTED 2.5 (BURR) ×3 IMPLANT
COLLAR CERV LO CONTOUR FIRM DE (SOFTGOODS) ×3 IMPLANT
COVER SURGICAL LIGHT HANDLE (MISCELLANEOUS) ×3 IMPLANT
DERMABOND ADVANCED (GAUZE/BANDAGES/DRESSINGS) ×2
DERMABOND ADVANCED .7 DNX12 (GAUZE/BANDAGES/DRESSINGS) ×1 IMPLANT
DRAPE C-ARM 42X72 X-RAY (DRAPES) ×3 IMPLANT
DRAPE HALF SHEET 40X57 (DRAPES) ×6 IMPLANT
DRAPE MICROSCOPE LEICA (MISCELLANEOUS) IMPLANT
DRAPE PED LAPAROTOMY (DRAPES) ×3 IMPLANT
DRAPE SURG 17X23 STRL (DRAPES) ×12 IMPLANT
DRILL NEURO 2X3.1 SOFT TOUCH (MISCELLANEOUS)
DRSG MEPILEX BORDER 4X4 (GAUZE/BANDAGES/DRESSINGS) ×2 IMPLANT
DRSG MEPILEX BORDER 4X8 (GAUZE/BANDAGES/DRESSINGS) IMPLANT
DURAPREP 6ML APPLICATOR 50/CS (WOUND CARE) ×3 IMPLANT
ELECT CAUTERY BLADE 6.4 (BLADE) ×3 IMPLANT
ELECT REM PT RETURN 9FT ADLT (ELECTROSURGICAL) ×3
ELECTRODE REM PT RTRN 9FT ADLT (ELECTROSURGICAL) ×1 IMPLANT
EVACUATOR 1/8 PVC DRAIN (DRAIN) IMPLANT
GAUZE SPONGE 4X4 12PLY STRL (GAUZE/BANDAGES/DRESSINGS) ×3 IMPLANT
GLOVE BIOGEL PI IND STRL 7.0 (GLOVE) IMPLANT
GLOVE BIOGEL PI IND STRL 8 (GLOVE) ×1 IMPLANT
GLOVE BIOGEL PI INDICATOR 7.0 (GLOVE) ×2
GLOVE BIOGEL PI INDICATOR 8 (GLOVE) ×2
GLOVE ECLIPSE 9.0 STRL (GLOVE) ×3 IMPLANT
GLOVE ORTHO TXT STRL SZ7.5 (GLOVE) ×3 IMPLANT
GLOVE SURG 8.5 LATEX PF (GLOVE) ×3 IMPLANT
GLOVE SURG SS PI 6.5 STRL IVOR (GLOVE) ×2 IMPLANT
GLOVE SURG SS PI 8.0 STRL IVOR (GLOVE) ×4 IMPLANT
GOWN STRL REUS W/ TWL LRG LVL3 (GOWN DISPOSABLE) ×1 IMPLANT
GOWN STRL REUS W/TWL 2XL LVL3 (GOWN DISPOSABLE) ×6 IMPLANT
GOWN STRL REUS W/TWL LRG LVL3 (GOWN DISPOSABLE) ×3
KIT BASIN OR (CUSTOM PROCEDURE TRAY) ×3 IMPLANT
KIT ROOM TURNOVER OR (KITS) ×3 IMPLANT
MANIFOLD NEPTUNE II (INSTRUMENTS) ×3 IMPLANT
NDL SPNL 18GX3.5 QUINCKE PK (NEEDLE) ×1 IMPLANT
NEEDLE SPNL 18GX3.5 QUINCKE PK (NEEDLE) ×3 IMPLANT
NS IRRIG 1000ML POUR BTL (IV SOLUTION) ×3 IMPLANT
PACK ORTHO CERVICAL (CUSTOM PROCEDURE TRAY) ×3 IMPLANT
PAD ARMBOARD 7.5X6 YLW CONV (MISCELLANEOUS) ×6 IMPLANT
PATTIES SURGICAL .25X.25 (GAUZE/BANDAGES/DRESSINGS) ×3 IMPLANT
PATTIES SURGICAL .75X.75 (GAUZE/BANDAGES/DRESSINGS) IMPLANT
SPONGE SURGIFOAM ABS GEL 100 (HEMOSTASIS) IMPLANT
SUT ETHIBOND CT1 BRD #0 30IN (SUTURE) IMPLANT
SUT VIC AB 0 CT1 27 (SUTURE)
SUT VIC AB 0 CT1 27XBRD ANBCTR (SUTURE) IMPLANT
SUT VIC AB 2-0 CT1 27 (SUTURE)
SUT VIC AB 2-0 CT1 TAPERPNT 27 (SUTURE) IMPLANT
SUT VIC AB 2-0 UR6 27 (SUTURE) IMPLANT
SUT VIC AB 3-0 X1 27 (SUTURE) ×3 IMPLANT
SUT VICRYL 0 UR6 27IN ABS (SUTURE) ×3 IMPLANT
TOWEL OR 17X24 6PK STRL BLUE (TOWEL DISPOSABLE) ×3 IMPLANT
TOWEL OR 17X26 10 PK STRL BLUE (TOWEL DISPOSABLE) ×3 IMPLANT
TRAY FOLEY W/METER SILVER 16FR (SET/KITS/TRAYS/PACK) ×2 IMPLANT
WATER STERILE IRR 1000ML POUR (IV SOLUTION) ×3 IMPLANT

## 2017-03-26 NOTE — Anesthesia Procedure Notes (Signed)
Procedure Name: Intubation Date/Time: 03/26/2017 12:19 PM Performed by: Ollen Bowl Pre-anesthesia Checklist: Patient identified, Emergency Drugs available, Suction available, Patient being monitored and Timeout performed Patient Re-evaluated:Patient Re-evaluated prior to induction Oxygen Delivery Method: Circle system utilized Preoxygenation: Pre-oxygenation with 100% oxygen Induction Type: IV induction Ventilation: Mask ventilation without difficulty Laryngoscope Size: Glidescope and 3 Grade View: Grade I Tube size: 6.5 mm Number of attempts: 1 Airway Equipment and Method: Patient positioned with wedge pillow,  Stylet and Video-laryngoscopy Placement Confirmation: ETT inserted through vocal cords under direct vision,  positive ETCO2 and breath sounds checked- equal and bilateral Secured at: 21 cm Dental Injury: Teeth and Oropharynx as per pre-operative assessment  Future Recommendations: Recommend- induction with short-acting agent, and alternative techniques readily available

## 2017-03-26 NOTE — Anesthesia Preprocedure Evaluation (Signed)
Anesthesia Evaluation  Patient identified by MRN, date of birth, ID band Patient awake    Reviewed: Allergy & Precautions, NPO status , Patient's Chart, lab work & pertinent test results  History of Anesthesia Complications (+) DIFFICULT AIRWAY  Airway Mallampati: II  TM Distance: <3 FB Neck ROM: Limited    Dental no notable dental hx.    Pulmonary neg pulmonary ROS, asthma ,    breath sounds clear to auscultation       Cardiovascular negative cardio ROS   Rhythm:Regular Rate:Normal     Neuro/Psych negative neurological ROS  negative psych ROS   GI/Hepatic negative GI ROS, Neg liver ROS,   Endo/Other  negative endocrine ROS  Renal/GU negative Renal ROS  negative genitourinary   Musculoskeletal negative musculoskeletal ROS (+)   Abdominal   Peds negative pediatric ROS (+)  Hematology negative hematology ROS (+)   Anesthesia Other Findings   Reproductive/Obstetrics negative OB ROS                             Anesthesia Physical Anesthesia Plan  ASA: II  Anesthesia Plan: General   Post-op Pain Management:    Induction: Intravenous  PONV Risk Score and Plan: 4 or greater and Ondansetron, Dexamethasone, Propofol infusion, Treatment may vary due to age or medical condition and Midazolam  Airway Management Planned: Oral ETT and Video Laryngoscope Planned  Additional Equipment:   Intra-op Plan:   Post-operative Plan: Extubation in OR  Informed Consent: I have reviewed the patients History and Physical, chart, labs and discussed the procedure including the risks, benefits and alternatives for the proposed anesthesia with the patient or authorized representative who has indicated his/her understanding and acceptance.   Dental advisory given  Plan Discussed with: CRNA  Anesthesia Plan Comments:         Anesthesia Quick Evaluation

## 2017-03-26 NOTE — Op Note (Signed)
03/26/2017  3:18 PM  PATIENT:  Marie Richardson  56 y.o. female  MRN: [id number]  OPERATIVE REPORT  PRE-OPERATIVE DIAGNOSIS:  cervical spondylosis right C5-6 and C6-7  POST-OPERATIVE DIAGNOSIS:  cervical spondylosis right C5-6 and C6-7  PROCEDURE:  Procedure(s): Right C5-6 and C6-7 Foraminotomies    SURGEON:  Jessy Oto, MD     ASSISTANT:  Larkin Ina CRNFA  (Present throughout the entire procedure and necessary for completion of procedure in a timely manner)     ANESTHESIA:  General,supplemented with local marcaine 0.5% 1:1 exparel 1.3% total 10cc, Dr. Orene Desanctis.   COMPLICATIONS:  None.   DRAINS: Foley to SSD during the case discontinued at the end of the case.   PROCEDURE:The patient was met in the holding area, and the appropriate cervical right C6-7 and C5-6 levels identified and marked with "x" and my initials. All questions were answered and informed consent signed.   The patient was then transported to OR. The patient was then placed under general anesthesia without difficulty and transferred to the operating room table prone position Mayfield horseshoe with chest rolls. All pressure points well-padded PAS stockings.. The patient received appropriate preoperative antibiotic prophylaxis.Time-out procedure was called and correct.   Sterile prep with DuraPrep and draped in the usual manner the shoulders were taped downwards and skin traction over the skin of the neck. Following DuraPrep draped in the usual manner. After timeout protocol incision was made approximately C5 to C6 in the midline. This following infiltration of skin and subcutaneous layers with marcaine 0.25% 1:1 exparel 1.3% total 10cc. Incision carried through skin and subcutaneous layers using 10 blade scalpel and electrocautery down to the level ligamentum nuchae. Incision made centered on the spinous process of C6 Towel clamp then placed at the spinous process of C 6 and C5 intraoperative C-arm fluoroscopy identified the  clamps at the C5-6 levels. Canning down one further spinous process then a marking pen was used to mark the right lamina of C 6. Electrocautery then used to carefully incise the cervical muscles off the right lateral aspect of the spinous process of C6, C5 and C7. Carefully removing spinous muscles off of the inferior aspect lamina at C5 and C6 exposing the C5-6 and C6-7 posterior aspect of the interlaminar spaces. The magnification headlamp were used during this portion procedure. Boss McCollough retractor was inserted. High-speed bur was used to remove a small portion of bone from the inferior aspect of lamina of C5 and C6, and the medial 20% of the intra-articular process of C5 and C6. Further thinning the superior aspect of the lamina of C6 and C7. A 1 mm Kerrison was then used to remove ligamentum flavum from superior aspect of the lamina C6 and the medial aspect of the inferior articular process of C5 approximately 20% exposing the superior articular process of C6.  A 1 mm Kerrison was used to remove bone off the superior aspect of the lamina of C5 and then resecting 20% of the medial aspect of the superior articular process of C6. Ligamentum flavum then easily lifted andand electrocautery used to cauterize epidural veins deep to  the ligamentum flavum and the 5 vascular leash overlying the C6 nerve root  was then resected. The operating room microscope was draped sterilely and brought into the field. Under the operating room microscope the epidural vein layer overlying the posterior aspect of the thecal sac and the C6  nerve root was then carefully lifted using a micro-titanium were cauterized using bipolar electrocautery the 15  blade scalpel then used to incise this overlying the C6 nerve root releasing the vascular leash a forward angle 3-0 microcurette then used to remove a small portion of bone off the superior and medial aspect of the pedicle further mobilizing the C 6 nerve root bipolar electrocautery  to control all bleeding within the axillary area and C6  nerve. Bone wax was applied to bleeding cancellus bone surfaces are excellent hemostasis obtained for C6  nerve root was tracked superiorly and laterally nerve hook. The disc explored using a Penfield 4 found not to be protruded.The obvious posterior mass effect was felt to represent uncovertebral spur. Following this then hemostasis was obtained using thrombin-soaked Gelfoam and micro-pledgettes. When complete hemostasis was obtained all trial was removed I nerve hook could be easily passed out the neuroforamen without the lateral aspect of the C6  pedicle demonstrate the C6 neuroforamen completely decompressed. A 1 mm Kerrison was then used to remove ligamentum flavum from superior aspect of the lamina C7 and the medial aspect of the inferior articular process of C6 approximately 20% exposing the superior articular process of C7.  A 1 mm Kerrison was used to remove bone off the superior aspect of the lamina of C7 and then resecting 20% of the medial aspect of the superior articular process of C7. Ligamentum flavum then easily lifted andand electrocautery used to cauterize epidural veins deep to  the ligamentum flavum and the  vascular leash overlying the C7 nerve root  was then resected. The operating room microscope was draped sterilely and brought into the field. Under the operating room microscope the epidural vein layer overlying the posterior aspect of the thecal sac and the C7  nerve root was then carefully lifted using a micro-titanium were cauterized using bipolar electrocautery the 15 blade scalpel then used to incise this overlying the C7  nerve root releasing the vascular leash a forward angle 3-0 microcurette then used to remove a small portion of bone off the superior and medial aspect of the pedicle further mobilizing the C7 nerve root bipolar electrocautery to control all bleeding within the axillary area and C7  nerve. Bone wax was applied to  bleeding cancellus bone surfaces are excellent hemostasis obtained for C7  nerve root was tracked superiorly and laterally nerve hook. The obvious posterior mass effect was felt to represent uncovertebral spur. Following this then hemostasis was obtained using thrombin-soaked Gelfoam and micro-pledgettes. When complete hemostasis was obtained all trial was removed I nerve hook could be easily passed out the neuroforamen without the lateral aspect of the C7  pedicle demonstrate the C7  neuroforamen completely decompressed. Irrigation was carried out no active bleeding was present. Following further irrigation and the incision was closed by approximating the ligamentum nuchae with 0 Ethibond sutures. The subcutaneous layers approximated with interrupted 0 Vicryl suture more superficial layers with interrupted 2-0 Vicryl sutures and the skin closed with interrupted 3-0 Vicryl sutures. Dermabond was applied then MedPlex bandage. Soft cervical collar all instrument and sponge counts were correct. Patient was then returned to supine position on her stretcher. Returned to recovery room in satisfactory condition.  Surgical  assistant's responsibilities: Larkin Ina CRNFA perform the duties of assistant physician and surgeon during this case present from the beginning of the case to the end of the case. He assisted with careful retraction of neural structures suctioning about her elements including cervical cord and C6 and C7 nerve roots. Performed closure of the incision on the ligamentum nuchae to the skin and application  of dressing. He assisted in positioning the patient had removal the patient from the OR table to the stretcher.        Sharyne Richters  03/26/2017, 3:18 PM

## 2017-03-26 NOTE — Transfer of Care (Signed)
Immediate Anesthesia Transfer of Care Note  Patient: Lillyahna Hemberger  Procedure(s) Performed: Procedure(s): Right C5-6 and C6-7 Foraminotomies (N/A)  Patient Location: PACU  Anesthesia Type:General  Level of Consciousness: awake and patient cooperative  Airway & Oxygen Therapy: Patient Spontanous Breathing and Patient connected to nasal cannula oxygen  Post-op Assessment: Report given to RN, Post -op Vital signs reviewed and stable and Patient moving all extremities X 4  Post vital signs: Reviewed and stable  Last Vitals:  Vitals:   03/26/17 0757 03/26/17 1520  BP: (!) 144/84 (!) 143/83  Pulse: 80 (!) 101  Resp: 18 15  Temp: 36.9 C (!) 36.4 C  SpO2: 97% 100%    Last Pain:  Vitals:   03/26/17 1520  TempSrc:   PainSc: 0-No pain         Complications: No apparent anesthesia complications

## 2017-03-26 NOTE — Interval H&P Note (Signed)
History and Physical Interval Note:  03/26/2017 11:40 AM  Marie Richardson  has presented today for surgery, with the diagnosis of cervical spondylosis right C5-6 and C6-7  The various methods of treatment have been discussed with the patient and family. After consideration of risks, benefits and other options for treatment, the patient has consented to  Procedure(s): Right C5-6 and C6-7 Foraminotomies (N/A) as a surgical intervention .  The patient's history has been reviewed, patient examined, no change in status, stable for surgery.  I have reviewed the patient's chart and labs.  Questions were answered to the patient's satisfaction.     Sharyne Richters

## 2017-03-26 NOTE — Brief Op Note (Signed)
03/26/2017  3:00 PM  PATIENT:  Marie Richardson  56 y.o. female  PRE-OPERATIVE DIAGNOSIS:  cervical spondylosis right C5-6 and C6-7  POST-OPERATIVE DIAGNOSIS:  cervical spondylosis right C5-6 and C6-7  PROCEDURE:  Procedure(s): Right C5-6 and C6-7 Foraminotomies (N/A)  SURGEON:  Surgeon(s) and Role:    * Jessy Oto, MD - Primary   ASSISTANTS: CRNFA, Justin   ANESTHESIA:   local and general, Dr. Orene Desanctis.  EBL:  Total I/O In: 1000 [I.V.:1000] Out: 300 [Urine:200; Blood:100]  BLOOD ADMINISTERED:none  DRAINS: none   LOCAL MEDICATIONS USED:  MARCAINE 0.25% 1:1 EXPAREL 1.3% Amount:10 ml  SPECIMEN:  No Specimen  DISPOSITION OF SPECIMEN:  N/A  COUNTS:  YES  TOURNIQUET:  * No tourniquets in log *  DICTATION: .Dragon Dictation  PLAN OF CARE: Admit for overnight observation  PATIENT DISPOSITION:  PACU - guarded condition.   Delay start of Pharmacological VTE agent (>24hrs) due to surgical blood loss or risk of bleeding: yes

## 2017-03-26 NOTE — Anesthesia Postprocedure Evaluation (Signed)
Anesthesia Post Note  Patient: Marie Richardson  Procedure(s) Performed: Procedure(s) (LRB): Right C5-6 and C6-7 Foraminotomies (N/A)     Patient location during evaluation: PACU Anesthesia Type: General Level of consciousness: awake and sedated Pain management: pain level controlled Vital Signs Assessment: post-procedure vital signs reviewed and stable Respiratory status: spontaneous breathing, nonlabored ventilation, respiratory function stable and patient connected to nasal cannula oxygen Cardiovascular status: blood pressure returned to baseline and stable Postop Assessment: no signs of nausea or vomiting Anesthetic complications: no    Last Vitals:  Vitals:   03/26/17 1620 03/26/17 1635  BP: 127/75 126/73  Pulse: 79 83  Resp: 10 11  Temp:    SpO2: 96% 95%    Last Pain:  Vitals:   03/26/17 1635  TempSrc:   PainSc: 3                  Brayla Pat,JAMES TERRILL

## 2017-03-26 NOTE — H&P (Signed)
PREOPERATIVE H&P  Chief Complaint: cervical spondylosis right C5-6 and C6-7  HPI: Marie Richardson is a 56 y.o. female who presents for preoperative history and physical with a diagnosis of cervical spondylosis right C5-6 and C6-7. Symptoms are rated as moderate to severe, and have been worsening.  This is significantly impairing activities of daily living.  She has elected for surgical management.   Past Medical History:  Diagnosis Date  . Asthma   . Complication of anesthesia    pt told she should always have a "pediatric intubation tube"  . 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 Endometriosis    Past Surgical History:  Procedure Laterality Date  . ABDOMINAL HYSTERECTOMY  2000  . APPENDECTOMY     secondary to endometriosis  . LAPAROSCOPIC ENDOMETRIOSIS FULGURATION     pt had 7 surgeries related to endometriosis   Social History   Social History  . Marital status: Married    Spouse name: N/A  . Number of children: N/A  . Years of education: N/A   Social History Main Topics  . Smoking status: Never Smoker  . Smokeless tobacco: Never Used  . Alcohol use Yes     Comment: very rarely  . Drug use: No  . Sexual activity: Not Asked   Other Topics Concern  . None   Social History Narrative  . None   History reviewed. No pertinent family history. Allergies  Allergen Reactions  . Other Diarrhea and Nausea And Vomiting    All seafood headache   Prior to Admission medications   Medication Sig Start Date End Date Taking? Authorizing Provider  diclofenac (CATAFLAM) 50 MG tablet Take 1 tablet (50 mg total) by mouth 2 (two) times daily. Patient taking differently: Take 50 mg by mouth daily.  01/14/17  Yes Jessy Oto, MD  diphenhydrAMINE (BENADRYL) 25 mg capsule Take 25 mg by mouth at bedtime as needed for allergies.   Yes [provider]  estradiol (ESTRACE) 2 MG tablet Take 2 mg by mouth at bedtime.   Yes [provider]  gabapentin  (NEURONTIN) 100 MG capsule TAKE 1 CAPSULE BY MOUTH AT BEDTIME Patient taking differently: TAKE 1 CAPSULE BY MOUTH DAILY IN EVENING WITH DINNER 03/12/17  Yes Jessy Oto, MD  traMADol-acetaminophen (ULTRACET) 37.5-325 MG tablet Take 1 tablet by mouth every 6 (six) hours as needed. Patient taking differently: Take 1 tablet by mouth 2 (two) times daily as needed for moderate pain.  03/08/17  Yes Jessy Oto, MD  zolpidem (AMBIEN) 10 MG tablet Take 5 mg by mouth at bedtime.  01/07/17  Yes [provider]  albuterol (PROVENTIL HFA;VENTOLIN HFA) 108 (90 Base) MCG/ACT inhaler Inhale 2 puffs into the lungs every 6 (six) hours as needed for wheezing or shortness of breath.    [provider]  diazepam (VALIUM) 5 MG tablet Take 1 tablet 1hour prior to MRI scan. Repeat if needed.  Patient will have driver for scan. Patient not taking: Reported on 03/12/2017 01/25/17   Jessy Oto, MD     Positive ROS: All other systems have been reviewed and were otherwise negative with the exception of those mentioned in the HPI and as above.  Physical Exam: General: Alert, no acute distress Cardiovascular: No pedal edema Respiratory: No cyanosis, no use of accessory musculature GI: No organomegaly, abdomen is soft and non-tender Skin: No lesions in the area of chief complaint Neurologic: Sensation intact distally Psychiatric: Patient is competent for consent  with normal mood and affect Lymphatic: No axillary or cervical lymphadenopathy  MUSCULOSKELETAL: Positive spurling sign with extension of the neck and right lateral rotation. Weak right biceps and right finger extension. 4/5, Weak in right wrist volar flexion.  MRI demonstrates right C5-6 moderately severe foramenal stenosis, mild right C6-7 foramenal stenosis. Assessment: cervical spondylosis right C5-6 and C6-7  Plan: Plan for Procedure(s): Right C5-6 and C6-7 Foraminotomies  The risks benefits and alternatives were discussed with the  patient including but not limited to the risks of nonoperative treatment, versus surgical intervention including infection, bleeding, nerve injury,  blood clots, cardiopulmonary complications, morbidity, mortality, among others, and they were willing to proceed.   Sharyne Richters, MD Cell [phone] Office [phone] 03/26/2017 11:38 AM

## 2017-03-27 ENCOUNTER — Encounter (HOSPITAL_COMMUNITY): Payer: Self-pay | Admitting: Specialist

## 2017-03-27 DIAGNOSIS — M47812 Spondylosis without myelopathy or radiculopathy, cervical region: Secondary | ICD-10-CM | POA: Diagnosis not present

## 2017-03-27 MED ORDER — METHOCARBAMOL 500 MG PO TABS: 500.0000 mg | ORAL_TABLET | Freq: Four times a day (QID) | ORAL | 1 refills | Status: DC | PRN

## 2017-03-27 MED ORDER — TRAMADOL-ACETAMINOPHEN 37.5-325 MG PO TABS: 1.0000 | ORAL_TABLET | Freq: Four times a day (QID) | ORAL | 0 refills | Status: DC | PRN

## 2017-03-27 MED ORDER — DOCUSATE SODIUM 100 MG PO CAPS: 100.0000 mg | ORAL_CAPSULE | Freq: Two times a day (BID) | ORAL | 0 refills | Status: DC

## 2017-03-27 MED ORDER — HYDROCODONE-ACETAMINOPHEN 5-325 MG PO TABS: 1.0000 | ORAL_TABLET | ORAL | 0 refills | Status: DC | PRN

## 2017-03-27 MED ORDER — GABAPENTIN 100 MG PO CAPS: 100.0000 mg | ORAL_CAPSULE | Freq: Two times a day (BID) | ORAL | 2 refills | Status: DC

## 2017-03-27 MED FILL — Thrombin For Soln 20000 Unit: CUTANEOUS | Qty: 1 | Status: AC

## 2017-03-27 NOTE — Progress Notes (Signed)
Patient alert and oriented, mae's well, voiding adequate amount of urine, swallowing without difficulty, no c/o pain. Patient discharged home with family. Script and discharged instructions given to patient. Patient and family stated understanding of d/c instructions given and has an appointment with MD. 

## 2017-03-27 NOTE — Discharge Summary (Signed)
Physician Discharge Summary      Patient ID: Marie Richardson MRN: [id number] DOB/AGE: Dec 01, 1960 56 y.o.  Admit date: 03/26/2017 Discharge date: 03/27/2017  Admission Diagnoses:  Principal Problem:   Other spondylosis with radiculopathy, cervical region Active Problems:   Status post laminectomy   Discharge Diagnoses:  Same  Past Medical History:  Diagnosis Date  . Asthma   . Complication of anesthesia    pt told she should always have a "pediatric intubation tube"  . 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 Endometriosis     Surgeries: Procedure(s): Right C5-6 and C6-7 Foraminotomies on 03/26/2017   Consultants:   Discharged Condition: Improved  Hospital Course: Marie Richardson is an 56 y.o. female who was admitted 03/26/2017 with a chief complaint of No chief complaint on file. , and found to have a diagnosis of Other spondylosis with radiculopathy, cervical region.  She was brought to the operating room on 03/26/2017 and underwent the above named procedures.    She was given perioperative antibiotics:  Anti-infectives    Start     Dose/Rate Route Frequency Ordered Stop   03/26/17 1715  ceFAZolin (ANCEF) IVPB 1 g/50 mL premix     1 g 100 mL/hr over 30 Minutes Intravenous Every 8 hours 03/26/17 1707 03/27/17 0230   03/26/17 0945  ceFAZolin (ANCEF) IVPB 2g/100 mL premix     2 g 200 mL/hr over 30 Minutes Intravenous On call to O.R. 03/25/17 1347 03/26/17 1233    Post operatively the foley was discontinued in the OR prior to  Activation from anesthesia and extubation. She recovered uneventfully in the PACU and she was transferred to [facility] bed #2. Her vital signs remained stable and she was able to void without difficulty. The preoperative right arm pain was relieved and her right arm strength was normal post surgery. POD#1 the dressing was dry and the incision without any drainage the dressing was changed and the her NV status was normal. She was taking and  tolerating po narcotic medications and po nourishment. She was discharged home.   She was given sequential compression devices and early ambulation for DVT prophylaxis.   She benefited maximally from her hospital stay and there were no complications.    Recent vital signs:  Vitals:   03/27/17 0500 03/27/17 0733  BP: 119/62 114/69  Pulse: 92 89  Resp: 17 18  Temp: 98.5 F (36.9 C) 98.8 F (37.1 C)  SpO2: 98% 98%    Recent laboratory studies:  Results for orders placed or performed during the hospital encounter of 03/26/17  Glucose, capillary  Result Value Ref Range   Glucose-Capillary 146 (H) 65 - 99 mg/dL    Discharge Medications:   Allergies as of 03/27/2017      Reactions   Other Diarrhea, Nausea And Vomiting   All seafood headache      Medication List    STOP taking these medications   diazepam 5 MG tablet Commonly known as:  VALIUM     TAKE these medications   albuterol 108 (90 Base) MCG/ACT inhaler Commonly known as:  PROVENTIL HFA;VENTOLIN HFA Inhale 2 puffs into the lungs every 6 (six) hours as needed for wheezing or shortness of breath.   diclofenac 50 MG tablet Commonly known as:  CATAFLAM Take 1 tablet (50 mg total) by mouth 2 (two) times daily. What changed:  when to take this   diphenhydrAMINE 25 mg capsule Commonly known as:  BENADRYL Take 25 mg by mouth  at bedtime as needed for allergies.   docusate sodium 100 MG capsule Commonly known as:  COLACE Take 1 capsule (100 mg total) by mouth 2 (two) times daily.   estradiol 2 MG tablet Commonly known as:  ESTRACE Take 2 mg by mouth at bedtime.   gabapentin 100 MG capsule Commonly known as:  NEURONTIN TAKE 1 CAPSULE BY MOUTH AT BEDTIME What changed:  See the new instructions.   gabapentin 100 MG capsule Commonly known as:  NEURONTIN Take 1 capsule (100 mg total) by mouth 2 (two) times daily. What changed:  You were already taking a medication with the same name, and this prescription was  added. Make sure you understand how and when to take each.   HYDROcodone-acetaminophen 5-325 MG tablet Commonly known as:  NORCO/VICODIN Take 1-2 tablets by mouth every 4 (four) hours as needed for moderate pain (breakthrough pain).   methocarbamol 500 MG tablet Commonly known as:  ROBAXIN Take 1 tablet (500 mg total) by mouth every 6 (six) hours as needed for muscle spasms.   traMADol-acetaminophen 37.5-325 MG tablet Commonly known as:  ULTRACET Take 1 tablet by mouth every 6 (six) hours as needed. What changed:  when to take this  reasons to take this   traMADol-acetaminophen 37.5-325 MG tablet Commonly known as:  ULTRACET Take 1-2 tablets by mouth every 6 (six) hours as needed for moderate pain. What changed:  You were already taking a medication with the same name, and this prescription was added. Make sure you understand how and when to take each.   zolpidem 10 MG tablet Commonly known as:  AMBIEN Take 5 mg by mouth at bedtime.            Discharge Care Instructions        Start     Ordered   03/27/17 0000  docusate sodium (COLACE) 100 MG capsule  2 times daily     03/27/17 0856   03/27/17 0000  methocarbamol (ROBAXIN) 500 MG tablet  Every 6 hours PRN     03/27/17 0856   03/27/17 0000  traMADol-acetaminophen (ULTRACET) 37.5-325 MG tablet  Every 6 hours PRN     03/27/17 0856   03/27/17 0000  HYDROcodone-acetaminophen (NORCO/VICODIN) 5-325 MG tablet  Every 4 hours PRN     03/27/17 0856   03/27/17 0000  gabapentin (NEURONTIN) 100 MG capsule  2 times daily     03/27/17 0856   03/27/17 0000  Call MD / Call 911    Comments:  If you experience chest pain or shortness of breath, CALL 911 and be transported to the hospital emergency room.  If you develope a fever above 101 F, pus (white drainage) or increased drainage or redness at the wound, or calf pain, call your surgeon's office.   03/27/17 0856   03/27/17 0000  Diet - low sodium heart healthy     03/27/17 0856    03/27/17 0000  Constipation Prevention    Comments:  Drink plenty of fluids.  Prune juice may be helpful.  You may use a stool softener, such as Colace (over the counter) 100 mg twice a day.  Use MiraLax (over the counter) for constipation as needed.   03/27/17 0856   03/27/17 0000  Increase activity slowly as tolerated     03/27/17 0856   03/27/17 0000  Discharge instructions    Comments:  No lifting greater than 10 lbs. Avoid bending, stooping and twisting. Walking in house for first week then may  start to get out slowly increasing activity using arms. Keep incision dry for 3 days, may use tegaderm or similar water impervious dressing. Avoid overhead use of arms and overhead lifting. Wear collar for comfort. Use ice as needed for comfort.   03/27/17 0856   03/27/17 0000  Driving restrictions    Comments:  No driving for 3-4 weeks   03/27/17 0856   03/27/17 0000  Lifting restrictions    Comments:  No lifting for 6 weeks   03/27/17 0856      Diagnostic Studies: Dg Cervical Spine 1 View  Result Date: 03/26/2017 CLINICAL DATA:  C5-7 foraminotomies. Intraoperative localization image. EXAM: CERVICAL SPINE 1 VIEW COMPARISON:  MRI cervical spine 01/26/2017. FINDINGS: Single intraoperative fluoroscopic spot view of the cervical spine in the lateral projection is provided. A single probe from a posterior approach is identified with its tip just above the C4-5 facet joints. IMPRESSION: Localization as above. Electronically Signed   By: Inge Rise M.D.   On: 03/26/2017 13:38   Dg C-arm 1-60 Min  Result Date: 03/26/2017 CLINICAL DATA:  C5-7 foraminotomies. Intraoperative localization image. EXAM: CERVICAL SPINE 1 VIEW COMPARISON:  MRI cervical spine 01/26/2017. FINDINGS: Single intraoperative fluoroscopic spot view of the cervical spine in the lateral projection is provided. A single probe from a posterior approach is identified with its tip just above the C4-5 facet joints. IMPRESSION:  Localization as above. Electronically Signed   By: Inge Rise M.D.   On: 03/26/2017 13:38   Xr C-arm No Report  Result Date: 03/06/2017 Please see Notes or Procedures tab for imaging impression.   Disposition: 01-Home or Self Care  Discharge Instructions    Call MD / Call 911    Complete by:  As directed    If you experience chest pain or shortness of breath, CALL 911 and be transported to the hospital emergency room.  If you develope a fever above 101 F, pus (white drainage) or increased drainage or redness at the wound, or calf pain, call your surgeon's office.   Constipation Prevention    Complete by:  As directed    Drink plenty of fluids.  Prune juice may be helpful.  You may use a stool softener, such as Colace (over the counter) 100 mg twice a day.  Use MiraLax (over the counter) for constipation as needed.   Diet - low sodium heart healthy    Complete by:  As directed    Discharge instructions    Complete by:  As directed    No lifting greater than 10 lbs. Avoid bending, stooping and twisting. Walking in house for first week then may start to get out slowly increasing activity using arms. Keep incision dry for 3 days, may use tegaderm or similar water impervious dressing. Avoid overhead use of arms and overhead lifting. Wear collar for comfort. Use ice as needed for comfort.   Driving restrictions    Complete by:  As directed    No driving for 3-4 weeks   Increase activity slowly as tolerated    Complete by:  As directed    Lifting restrictions    Complete by:  As directed    No lifting for 6 weeks      Follow-up Information    Jessy Oto, MD Follow up in 2 week(s).   Specialty:  Orthopedic Surgery Why:  For wound re-check Contact information: [place] [address] [phone]  Signed: Sharyne Richters 03/27/2017, 4:35 PM

## 2017-03-27 NOTE — Therapy (Signed)
Occupational Therapy Evaluation Patient Details Name: Marie Richardson MRN: [id number] DOB: Jul 12, 1961 Today's Date: 03/27/2017    History of Present Illness 56 y.o. female who presents for preoperative history and physical with a diagnosis of cervical spondylosis right C5-6 and C6-7, patient now s/p Right C5-6 and C6-7 Foraminotomies    Clinical Impression   Pt reports being independent in all ADLs PTA. Currently pt functioning at modified independent level for to increased time due to neck pain and precautions. Pt educated on compensatory methods to complete ADLs while adhering to cervical precautions. Pt has family available to provided assistance as needed. No further acute OT needs. OT will sign off.     Follow Up Recommendations  No OT follow up    Equipment Recommendations  None recommended by OT    Recommendations for Other Services       Precautions / Restrictions Precautions Precautions: Cervical Precaution Comments: reviewed verbally with patient Required Braces or Orthoses: Cervical Brace Cervical Brace: Soft collar      Mobility Bed Mobility Overal bed mobility: Modified Independent             General bed mobility comments: increased time to perform, no physical assist required.   Transfers Overall transfer level: Independent Equipment used: None                  Balance Overall balance assessment: No apparent balance deficits (not formally assessed)                                         ADL either performed or assessed with clinical judgement   ADL Overall ADL's : Modified independent Eating/Feeding: Modified independent   Grooming: Modified independent;Standing   Upper Body Bathing: Modified independent;Standing   Lower Body Bathing: Modified independent   Upper Body Dressing : Modified independent;Sitting   Lower Body Dressing: Modified independent Lower Body Dressing Details (indicate cue type and reason): Pt  able to bring foot over knee to complete LB dressing Toilet Transfer: Modified Independent Toilet Transfer Details (indicate cue type and reason): Pt able to transfer on/off toilet with use of countertop for support.  Toileting- Water quality scientist and Hygiene: Independent   Scientist, research (medical): Tub transfer;Modified independent   Functional mobility during ADLs: Modified independent General ADL Comments: Pt requires increased time to complete ADLs due to neck pain and precautions. Pt educated on keeping items at counter top height to increase independence.      Vision         Perception     Praxis      Pertinent Vitals/Pain Pain Assessment: 0-10 Pain Score: 7  Faces Pain Scale: Hurts little more Pain Location: Neck Pain Descriptors / Indicators: Sore Pain Intervention(s): Monitored during session     Hand Dominance Right   Extremity/Trunk Assessment Upper Extremity Assessment Upper Extremity Assessment: Overall WFL for tasks assessed   Lower Extremity Assessment Lower Extremity Assessment: Overall WFL for tasks assessed   Cervical / Trunk Assessment Cervical / Trunk Assessment:  (s/p cervical surgery)   Communication Communication Communication: No difficulties   Cognition Arousal/Alertness: Awake/alert Behavior During Therapy: WFL for tasks assessed/performed Overall Cognitive Status: Within Functional Limits for tasks assessed  General Comments  Pt reports no tingling or numbness in UEs.     Exercises     Shoulder Instructions      Home Living Family/patient expects to be discharged to:: Private residence Living Arrangements: Spouse/significant other Available Help at Discharge: Family Type of Home: House Home Access: Stairs to enter Technical brewer of Steps: 14 Entrance Stairs-Rails: Can reach both Home Layout: One level     Bathroom Shower/Tub: Teacher, early years/pre:  Standard Bathroom Accessibility: Yes How Accessible: Accessible via walker Home Equipment: None          Prior Functioning/Environment Level of Independence: Independent                 OT Problem List:       OT Treatment/Interventions:      OT Goals(Current goals can be found in the care plan section) Acute Rehab OT Goals OT Goal Formulation: (P) All assessment and education complete, DC therapy  OT Frequency:     Barriers to D/C:            Co-evaluation              AM-PAC PT "6 Clicks" Daily Activity     Outcome Measure Help from another person eating meals?: None Help from another person taking care of personal grooming?: None Help from another person toileting, which includes using toliet, bedpan, or urinal?: None Help from another person bathing (including washing, rinsing, drying)?: None Help from another person to put on and taking off regular upper body clothing?: None Help from another person to put on and taking off regular lower body clothing?: None 6 Click Score: 24   End of Session Equipment Utilized During Treatment: Cervical collar  Activity Tolerance: Patient tolerated treatment well Patient left: in bed;with call bell/phone within reach;with family/visitor present  OT Visit Diagnosis: Pain Pain - part of body:  (Neck)                Time: 2703-5009 OT Time Calculation (min): 11 min Charges:  OT General Charges $OT Visit: (P) 1 Procedure OT Treatments $Self Care/Home Management : (P) 8-22 mins G-Codes: OT G-codes **NOT FOR INPATIENT CLASS** Functional Assessment Tool Used: (P) AM-PAC 6 Clicks Daily Activity Functional Limitation: (P) Self care   Boykin Peek, OTS [phone]   Boykin Peek 03/27/2017, 9:25 AM

## 2017-03-27 NOTE — Evaluation (Signed)
Physical Therapy Evaluation Patient Details Name: Marie Richardson MRN: [id number] DOB: [birth year] Today's Date: 03/27/2017   History of Present Illness  56 y.o. female who presents for preoperative history and physical with a diagnosis of cervical spondylosis right C5-6 and C6-7, patient now s/p Right C5-6 and C6-7 Foraminotomies   Clinical Impression  Patient seen for mobility assessment s/p spinal surgery (cervical). Mobilizing well. Educated patient on precautions, mobility expectations, safety and car transfers. No further acute PT needs. Will sign off.     Follow Up Recommendations No PT follow up    Equipment Recommendations  None recommended by PT    Recommendations for Other Services       Precautions / Restrictions Precautions Precautions: Cervical Precaution Comments: reviewed verbally with patient Required Braces or Orthoses: Cervical Brace Cervical Brace: Soft collar      Mobility  Bed Mobility Overal bed mobility: Modified Independent             General bed mobility comments: increased time to perform, no physical assist required. good technique  Transfers Overall transfer level: Independent Equipment used: None                Ambulation/Gait Ambulation/Gait assistance: Independent Ambulation Distance (Feet): 160 Feet Assistive device: None Gait Pattern/deviations: WFL(Within Functional Limits) Gait velocity: decreased   General Gait Details: steady with ambulation, no physical assist required  Stairs Stairs: Yes Stairs assistance: Supervision Stair Management: One rail Right;Step to pattern Number of Stairs: 12 General stair comments: educated on technique for blind negotiation  Wheelchair Mobility    Modified Rankin (Stroke Patients Only)       Balance Overall balance assessment: No apparent balance deficits (not formally assessed)                                           Pertinent Vitals/Pain Pain  Assessment: Faces Faces Pain Scale: Hurts little more Pain Location: neck/shoulders Pain Descriptors / Indicators: Sore Pain Intervention(s): Monitored during session    Home Living Family/patient expects to be discharged to:: Private residence Living Arrangements: Spouse/significant other Available Help at Discharge: Family Type of Home: House Home Access: Stairs to enter Entrance Stairs-Rails: Can reach both Entrance Stairs-Number of Steps: 14 Home Layout: One level Home Equipment: None      Prior Function Level of Independence: Independent               Hand Dominance   Dominant Hand: Right    Extremity/Trunk Assessment   Upper Extremity Assessment Upper Extremity Assessment: Overall WFL for tasks assessed    Lower Extremity Assessment Lower Extremity Assessment: Overall WFL for tasks assessed    Cervical / Trunk Assessment Cervical / Trunk Assessment:  (s/p cervical surgery)  Communication   Communication: No difficulties  Cognition Arousal/Alertness: Awake/alert Behavior During Therapy: WFL for tasks assessed/performed Overall Cognitive Status: Within Functional Limits for tasks assessed                                        General Comments      Exercises     Assessment/Plan    PT Assessment Patent does not need any further PT services  PT Problem List         PT Treatment Interventions      PT Goals (Current  goals can be found in the Care Plan section)  Acute Rehab PT Goals PT Goal Formulation: All assessment and education complete, DC therapy    Frequency     Barriers to discharge        Co-evaluation               AM-PAC PT "6 Clicks" Daily Activity  Outcome Measure Difficulty turning over in bed (including adjusting bedclothes, sheets and blankets)?: None Difficulty moving from lying on back to sitting on the side of the bed? : A Little Difficulty sitting down on and standing up from a chair with arms  (e.g., wheelchair, bedside commode, etc,.)?: None Help needed moving to and from a bed to chair (including a wheelchair)?: None Help needed walking in hospital room?: None Help needed climbing 3-5 steps with a railing? : None 6 Click Score: 23    End of Session Equipment Utilized During Treatment: Cervical collar Activity Tolerance: Patient tolerated treatment well Patient left: in bed;with call bell/phone within reach;with family/visitor present Nurse Communication: Mobility status PT Visit Diagnosis: Pain Pain - part of body:  (neck)    Time: 7824-2353 PT Time Calculation (min) (ACUTE ONLY): 17 min   Charges:   PT Evaluation $PT Eval Low Complexity: 1 Low     PT G Codes:   PT G-Codes **NOT FOR INPATIENT CLASS** Functional Assessment Tool Used: Clinical judgement Functional Limitation: Mobility: Walking and moving around Mobility: Walking and Moving Around Current Status (I1443): At least 1 percent but less than 20 percent impaired, limited or restricted Mobility: Walking and Moving Around Goal Status [phone]): At least 1 percent but less than 20 percent impaired, limited or restricted Mobility: Walking and Moving Around Discharge Status [phone]): At least 1 percent but less than 20 percent impaired, limited or restricted    Alben Deeds, PT DPT  Board Certified Neurologic Specialist [place] 03/27/2017, 8:43 AM

## 2017-03-27 NOTE — Progress Notes (Signed)
     Subjective: 1 Day Post-Op Procedure(s) (LRB): Right C5-6 and C6-7 Foraminotomies (N/A) No numbness or paresthesias. Soft collar intact.  Tolerating po nourishment. Nauseated last night post anesthesia. Voiding well.  Patient reports pain as moderate.    Objective:   VITALS:  Temp:  [97.5 F (36.4 C)-98.8 F (37.1 C)] 98.8 F (37.1 C) (08/22 0733) Pulse Rate:  [75-101] 89 (08/22 0733) Resp:  [10-18] 18 (08/22 0733) BP: (114-143)/(62-83) 114/69 (08/22 0733) SpO2:  [95 %-100 %] 98 % (08/22 0733)  Neurologically intact ABD soft Neurovascular intact Sensation intact distally Intact pulses distally Dorsiflexion/Plantar flexion intact Incision: no drainage and No fluctuance or swelling   LABS No results for input(s): HGB, WBC, PLT in the last 72 hours. No results for input(s): NA, K, CL, CO2, BUN, CREATININE, GLUCOSE in the last 72 hours. No results for input(s): LABPT, INR in the last 72 hours.   Assessment/Plan: 1 Day Post-Op Procedure(s) (LRB): Right C5-6 and C6-7 Foraminotomies (N/A)  Advance diet Up with therapy D/C IV fluids Discharge home.  Sharyne Richters 03/27/2017, 8:50 AM Patient ID: Marie Richardson, female   DOB: 1961-04-27, 56 y.o.   MRN: [id number]

## 2017-03-28 ENCOUNTER — Telehealth (INDEPENDENT_AMBULATORY_CARE_PROVIDER_SITE_OTHER): Payer: Self-pay | Admitting: Specialist

## 2017-03-28 ENCOUNTER — Telehealth (INDEPENDENT_AMBULATORY_CARE_PROVIDER_SITE_OTHER): Payer: Self-pay | Admitting: Radiology

## 2017-03-28 NOTE — Telephone Encounter (Signed)
Patient's mother Butch Penny) called advised Dr Louanne Skye wanted to see patient in 2 weeks after surgery. Patient is scheduled for 04/24/17. She advised Dr. Louanne Skye stated he wanted to see patient before then. The number to contact Butch Penny is [phone]

## 2017-03-28 NOTE — Telephone Encounter (Signed)
Prior authorization for medication was submitted yesterday and was still pending. Patient aware. Insurance is denying tramadol completely and will only approve hydrocodone 9 a day, instead of 12 a day. They will get the hydrocodone to hold them over for the interim while insurance is pending.

## 2017-03-28 NOTE — Telephone Encounter (Signed)
I made an appointment with Jeneen Rinks on 04/11/17 per Dr. Louanne Skye. I called and spoke with Butch Penny patients mother to advise this.

## 2017-03-29 ENCOUNTER — Telehealth (INDEPENDENT_AMBULATORY_CARE_PROVIDER_SITE_OTHER): Payer: Self-pay | Admitting: Specialist

## 2017-03-29 NOTE — Telephone Encounter (Signed)
Please advise on shower and dressing change.

## 2017-03-29 NOTE — Telephone Encounter (Signed)
Patient called asked for a call back concerning her being able to shower and if she is suppose to change the dressing. The number to contact patient is [phone]

## 2017-04-04 ENCOUNTER — Telehealth (INDEPENDENT_AMBULATORY_CARE_PROVIDER_SITE_OTHER): Payer: Self-pay

## 2017-04-04 NOTE — Telephone Encounter (Signed)
I called and spoke with patient she states that her Right arm/hand is not any better, that it is worsening.  She has had a decrease in her gripping, reaching arm out hurts, she states that her hand/arm was shaking this morning when she tried to reach out to pick up her coffee.  She has a f/u appt on Tuesday 04/09/17.--Please advise

## 2017-04-04 NOTE — Telephone Encounter (Signed)
Patient would like a call back stated she has some questions to ask.  Cb# is [phone].  Please advise.  Thank You.

## 2017-04-04 NOTE — Telephone Encounter (Signed)
Please call and ring her in tomorrow, Friday

## 2017-04-04 NOTE — Telephone Encounter (Signed)
Discharge orders address this it may  Very well be healed usually 4-7 days to heal may go without dressing.And bathe without dressing. jen

## 2017-04-05 ENCOUNTER — Ambulatory Visit (INDEPENDENT_AMBULATORY_CARE_PROVIDER_SITE_OTHER): Payer: PRIVATE HEALTH INSURANCE | Admitting: Specialist

## 2017-04-05 ENCOUNTER — Encounter (INDEPENDENT_AMBULATORY_CARE_PROVIDER_SITE_OTHER): Payer: Self-pay | Admitting: Specialist

## 2017-04-05 DIAGNOSIS — M4722 Other spondylosis with radiculopathy, cervical region: Secondary | ICD-10-CM

## 2017-04-05 MED ORDER — METHYLPREDNISOLONE 4 MG PO TABS: ORAL_TABLET | ORAL | 0 refills | Status: DC

## 2017-04-05 MED ORDER — TRAMADOL-ACETAMINOPHEN 37.5-325 MG PO TABS: 1.0000 | ORAL_TABLET | Freq: Four times a day (QID) | ORAL | 0 refills | Status: DC | PRN

## 2017-04-05 NOTE — Patient Instructions (Signed)
Avoid overhead lifting and overhead use of the arms. Do not lift greater than 10 lbs. Adjust head rest in vehicle to prevent hyperextension if rear ended.

## 2017-04-05 NOTE — Progress Notes (Signed)
   Post-Op Visit Note   Patient: Marie Richardson           Date of Birth: Nov 27, 1960           MRN: [id number] Visit Date: 04/05/2017 PCP: Patient, No Pcp Per   Assessment & Plan: 10 days pos right C5-6 and C6-7.  Chief Complaint:  Chief Complaint  Patient presents with  . Neck - Routine Post Op    C/o right arm/hand weakness   Visit Diagnoses:  1. Other spondylosis with radiculopathy, cervical region     Plan: Avoid overhead lifting and overhead use of the arms. Do not lift greater than 10 lbs. Adjust head rest in vehicle to prevent hyperextension if rear ended.      Follow-Up Instructions: Return for next week Tues appt..   Orders:  No orders of the defined types were placed in this encounter.  Meds ordered this encounter  Medications  . methylPREDNISolone (MEDROL) 4 MG tablet    Sig: Take one tablet po q day for 2 weeks the one half tablet po for 2 weeks then discontinue, in AM with meal or snack.    Dispense:  21 tablet    Refill:  0  . traMADol-acetaminophen (ULTRACET) 37.5-325 MG tablet    Sig: Take 1-2 tablets by mouth every 6 (six) hours as needed for moderate pain.    Dispense:  40 tablet    Refill:  0    Imaging: No results found.  PMFS History: Patient Active Problem List   Diagnosis Date Noted  . Other spondylosis with radiculopathy, cervical region 03/26/2017    Priority: High    Class: Temporary  . Status post laminectomy 03/26/2017   Past Medical History:  Diagnosis Date  . Asthma   . Complication of anesthesia    pt told she should always have a "pediatric intubation tube"  . 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 Endometriosis     No family history on file.  Past Surgical History:  Procedure Laterality Date  . ABDOMINAL HYSTERECTOMY  2000  . APPENDECTOMY     secondary to endometriosis  . LAPAROSCOPIC ENDOMETRIOSIS FULGURATION     pt had 7 surgeries related to endometriosis  . POSTERIOR CERVICAL  FUSION/FORAMINOTOMY N/A 03/26/2017   Procedure: Right C5-6 and C6-7 Foraminotomies;  Surgeon: Jessy Oto, MD;  Location: [facility];  Service: Orthopedics;  Laterality: N/A;   Social History   Occupational History  . Not on file.   Social History Main Topics  . Smoking status: Never Smoker  . Smokeless tobacco: Never Used  . Alcohol use Yes     Comment: very rarely  . Drug use: No  . Sexual activity: Not on file

## 2017-04-05 NOTE — Telephone Encounter (Signed)
Worked in @ 1030am

## 2017-04-11 ENCOUNTER — Encounter (INDEPENDENT_AMBULATORY_CARE_PROVIDER_SITE_OTHER): Payer: Self-pay | Admitting: Surgery

## 2017-04-11 ENCOUNTER — Ambulatory Visit (INDEPENDENT_AMBULATORY_CARE_PROVIDER_SITE_OTHER): Payer: PRIVATE HEALTH INSURANCE | Admitting: Surgery

## 2017-04-11 ENCOUNTER — Ambulatory Visit (INDEPENDENT_AMBULATORY_CARE_PROVIDER_SITE_OTHER): Payer: PRIVATE HEALTH INSURANCE

## 2017-04-11 ENCOUNTER — Ambulatory Visit (INDEPENDENT_AMBULATORY_CARE_PROVIDER_SITE_OTHER): Payer: PRIVATE HEALTH INSURANCE | Admitting: Specialist

## 2017-04-11 VITALS — BP 134/91 | HR 81 | Ht 65.0 in | Wt 139.0 lb

## 2017-04-11 DIAGNOSIS — M542 Cervicalgia: Secondary | ICD-10-CM

## 2017-04-11 DIAGNOSIS — G5691 Unspecified mononeuropathy of right upper limb: Secondary | ICD-10-CM

## 2017-04-11 DIAGNOSIS — M25531 Pain in right wrist: Secondary | ICD-10-CM | POA: Diagnosis not present

## 2017-04-11 NOTE — Progress Notes (Signed)
Office Visit Note   Patient: Marie Richardson           Date of Birth: September 09, 1960           MRN: [id number] Visit Date: 04/11/2017              Requested by: No referring provider defined for this encounter. PCP: Patient, No Pcp Per   Assessment & Plan: Visit Diagnoses:  1. Neck pain   2. Pain in right wrist   3. Cervicalgia   4. Neuropathy, arm, right     Plan: Advised patient that I think that her neck is doing well at this point after C5-6 and C6-7 foraminotomies. With the increased right hand symptoms that she is having I think that this may be from double crush issue regarding her previous neck problem and possible carpal tunnel syndrome. I will schedule NCV/EMG study right upper extremity. Patient will follow up with Dr.nitka after completion to discuss results. With the calcific changes that were seen on lateral wrist x-ray we may consider getting an MRI depending upon the results of her nerve conduction tests.  Follow-Up Instructions: Return in about 2 weeks (around 04/25/2017) for review ncv/emg study Dr Louanne Skye.   Orders:  Orders Placed This Encounter  Procedures  . XR Cervical Spine 2 or 3 views  . XR Wrist 2 Views Right  . Ambulatory referral to Physical Medicine Rehab   No orders of the defined types were placed in this encounter.     Procedures: No procedures performed   Clinical Data: No additional findings.   Subjective: Chief Complaint  Patient presents with  . Neck - Routine Post Op    HPI Patient returns. 2 weeks status post right C5-6 and C6-7 foraminotomies. States that the pain, burning, numbness and tingling that she was previously having in her right shoulder and scapular area is gone. She does continue to complain of pain numbness tingling into the right wrist and hand. She feels that this may be a little worse. Pain and stiffness in her wrist that is worse with extension but also aggravated with wrist flexion. No injury. No problems on the left  side. States that she does not have any other joints that are problematic. Review of Systems No current cardiac pulmonary GI GU issues.  Objective: Vital Signs: BP (!) 134/91 (BP Location: Left Arm, Patient Position: Sitting)   Pulse 81   Ht 5\' 5"  (1.651 m)   Wt 139 lb (63 kg)   BMI 23.13 kg/m   Physical Exam  Constitutional: She is oriented to person, place, and time. No distress.  HENT:  Head: Normocephalic.  Eyes: Pupils are equal, round, and reactive to light. EOM are normal.  Neck:  Positive right brachial plexus trapezius tenderness. Posterior neck surgical incision is well-healed.  Pulmonary/Chest: No respiratory distress.  Musculoskeletal:  File shoulders good range motion. Bilateral elbows good range of motion. Negative Tinel's over the cubital tunnels. Right wrist she does have some limitation in range of motion due to pain. Maybe mild wrist swelling. Moderate to markedly positive Tinel's. Wrist joint tender to palpation. Mildly tender over the first [facility] joint and also over the first dorsal compartment which is mild. Trace right grip weakness. Left wrist and hand unremarkable.  Neurological: She is oriented to person, place, and time.    Ortho Exam  Specialty Comments:  No specialty comments available.  Imaging: No results found.   PMFS History: Patient Active Problem List   Diagnosis Date  Noted  . Status post laminectomy 03/26/2017  . Other spondylosis with radiculopathy, cervical region 03/26/2017    Class: Temporary   Past Medical History:  Diagnosis Date  . Asthma   . Complication of anesthesia    pt told she should always have a "pediatric intubation tube"  . 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 Endometriosis     No family history on file.  Past Surgical History:  Procedure Laterality Date  . ABDOMINAL HYSTERECTOMY  2000  . APPENDECTOMY     secondary to endometriosis  . LAPAROSCOPIC ENDOMETRIOSIS FULGURATION     pt had 7  surgeries related to endometriosis  . POSTERIOR CERVICAL FUSION/FORAMINOTOMY N/A 03/26/2017   Procedure: Right C5-6 and C6-7 Foraminotomies;  Surgeon: Jessy Oto, MD;  Location: [facility];  Service: Orthopedics;  Laterality: N/A;   Social History   Occupational History  . Not on file.   Social History Main Topics  . Smoking status: Never Smoker  . Smokeless tobacco: Never Used  . Alcohol use Yes     Comment: very rarely  . Drug use: No  . Sexual activity: Not on file

## 2017-04-23 ENCOUNTER — Encounter (INDEPENDENT_AMBULATORY_CARE_PROVIDER_SITE_OTHER): Payer: Self-pay | Admitting: Physical Medicine and Rehabilitation

## 2017-04-23 ENCOUNTER — Ambulatory Visit (INDEPENDENT_AMBULATORY_CARE_PROVIDER_SITE_OTHER): Payer: PRIVATE HEALTH INSURANCE | Admitting: Physical Medicine and Rehabilitation

## 2017-04-23 DIAGNOSIS — R202 Paresthesia of skin: Secondary | ICD-10-CM | POA: Diagnosis not present

## 2017-04-23 NOTE — Progress Notes (Deleted)
Right hand pain and numbness. Feels like most of hand. Difficulty with grip. Feels it almost all the time. Right hand dominant

## 2017-04-23 NOTE — Progress Notes (Signed)
Marie Richardson - 56 y.o. female MRN [id number]  Date of birth: February 02, 1961  Office Visit Note: Visit Date: 04/23/2017 PCP: Patient, No Pcp Per Referred by: Marie Crumbly, PA-C  Subjective: Chief Complaint  Patient presents with  . Right Hand - Pain, Numbness   HPI: [name] is a 56 year old right-hand dominant female with continued severe right hand pain which is somewhat global. She reports difficulty gripping objects on the right. She reports fairly constant pain and pain with any sort of motion of the hand or even touching of the arm or hand. She feels like this is constant. She reports none of the medications that she has tried his helped at all. 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 She underwent injections of the cervical spine without relief. She has now undergone right C5-6 and C6-7 foraminotomies on 03/26/2017. This did not help the hand pain. She reports that it did help the shoulder blade and neck pain quite a bit. The numbness tingling-type pain she is having shoulder blade has helped. She denies any left-sided symptoms. She reports possibly worsening hand pain pain with any motion. She does report most of the tingling symptoms into the radial digits. She saw Benjiman Core recently who felt like this may be a double crush type problems with concomitant carpal tunnel syndrome.    ROS Otherwise per HPI.  Assessment & Plan: Visit Diagnoses:  1. Paresthesia of skin     Plan: No additional findings.  Impression: Essentially NORMAL electrodiagnostic study of the right upper limb.  There is no significant electrodiagnostic evidence of nerve entrapment, brachial plexopathy or cervical radiculopathy.    **As you know, purely sensory or demyelinating radiculopathies and chemical radiculitis may not be detected with this particular electrodiagnostic study. She is only 54 weeks out from cervical surgery. If this is more of a demyelinating  radiculopathy this may require quite a bit of time for the C6 nerve root to heal. This would not show up on this electrodiagnostic study.   Recommendations: 1.  Follow-up with referring physician. 2.  Continue current management of symptoms. She is only 54 weeks out from cervical surgery. If this is more of a demyelinating radiculopathy this may require quite a bit of time for the C6 nerve root to heal. This would not show up on this electrodiagnostic study.  Meds & Orders: No orders of the defined types were placed in this encounter.   Orders Placed This Encounter  Procedures  . NCV with EMG (electromyography)    Follow-up: Return for Dr. Louanne Skye.   Procedures: No procedures performed  EMG & NCV Findings: All nerve conduction studies (as indicated in the following tables) were within normal limits.    All examined muscles (as indicated in the following table) showed no evidence of electrical instability.    Impression: Essentially NORMAL electrodiagnostic study of the right upper limb.  There is no significant electrodiagnostic evidence of nerve entrapment, brachial plexopathy or cervical radiculopathy.    **As you know, purely sensory or demyelinating radiculopathies and chemical radiculitis may not be detected with this particular electrodiagnostic study. She is only 54 weeks out from cervical surgery. If this is more of a demyelinating radiculopathy this may require quite a bit of time for the C6 nerve root to heal. This would not show up on this electrodiagnostic study.   Recommendations: 1.  Follow-up with referring physician. 2.  Continue current management of symptoms. She is only 54 weeks out  from cervical surgery. If this is more of a demyelinating radiculopathy this may require quite a bit of time for the C6 nerve root to heal. This would not show up on this electrodiagnostic study.   Nerve Conduction Studies Anti Sensory Summary Table   Stim Site NR Peak (ms) Norm Peak (ms)  P-T Amp (V) Norm P-T Amp Site1 Site2 Delta-P (ms) Dist (cm) Vel (m/s) Norm Vel (m/s)  Right Median Acr Palm Anti Sensory (2nd Digit)  33.5C  Wrist    3.0 <3.6 36.7 >10 Wrist Palm 1.4 0.0    Palm    1.6 <2.0 43.7         Right Radial Anti Sensory (Base 1st Digit)  32.4C  Wrist    2.0 <3.1 27.9  Wrist Base 1st Digit 2.0 0.0    Right Ulnar Anti Sensory (5th Digit)  33C  Wrist    3.0 <3.7 25.3 >15.0 Wrist 5th Digit 3.0 14.0 47 >38   Motor Summary Table   Stim Site NR Onset (ms) Norm Onset (ms) O-P Amp (mV) Norm O-P Amp Site1 Site2 Delta-0 (ms) Dist (cm) Vel (m/s) Norm Vel (m/s)  Right Median Motor (Abd Poll Brev)  31.7C  Wrist    2.8 <4.2 5.1 >5 Elbow Wrist 3.9 21.8 56 >50  Elbow    6.7  4.8         Right Ulnar Motor (Abd Dig Min)  31.3C  Wrist    2.7 <4.2 7.6 >3 B Elbow Wrist 3.0 19.0 63 >53  B Elbow    5.7  7.3  A Elbow B Elbow 1.1 10.5 95 >53  A Elbow    6.8  7.2          EMG   Side Muscle Nerve Root Ins Act Fibs Psw Amp Dur Poly Recrt Int Fraser Din Comment  Right 1stDorInt Ulnar C8-T1 Nml Nml Nml Nml Nml 0 Nml Nml   Right Abd Poll Brev Median C8-T1 Nml Nml Nml Nml Nml 0 Nml Nml   Right ExtDigCom   Nml Nml Nml Nml Nml 0 Nml Nml   Right Triceps Radial C6-7-8 Nml Nml Nml Nml Nml 0 Nml Nml   Right Deltoid Axillary C5-6 Nml Nml Nml Nml Nml 0 Nml Nml     Nerve Conduction Studies Anti Sensory Left/Right Comparison   Stim Site L Lat (ms) R Lat (ms) L-R Lat (ms) L Amp (V) R Amp (V) L-R Amp (%) Site1 Site2 L Vel (m/s) R Vel (m/s) L-R Vel (m/s)  Median Acr Palm Anti Sensory (2nd Digit)  33.5C  Wrist  3.0   36.7  Wrist Palm     Palm  1.6   43.7        Radial Anti Sensory (Base 1st Digit)  32.4C  Wrist  2.0   27.9  Wrist Base 1st Digit     Ulnar Anti Sensory (5th Digit)  33C  Wrist  3.0   25.3  Wrist 5th Digit  47    Motor Left/Right Comparison   Stim Site L Lat (ms) R Lat (ms) L-R Lat (ms) L Amp (mV) R Amp (mV) L-R Amp (%) Site1 Site2 L Vel (m/s) R Vel (m/s) L-R Vel (m/s)    Median Motor (Abd Poll Brev)  31.7C  Wrist  2.8   5.1  Elbow Wrist  56   Elbow  6.7   4.8        Ulnar Motor (Abd Dig Min)  31.3C  Wrist  2.7  7.6  B Elbow Wrist  63   B Elbow  5.7   7.3  A Elbow B Elbow  95   A Elbow  6.8   7.2           Waveforms:            Clinical History: MRI CERVICAL SPINE WITHOUT CONTRAST  TECHNIQUE: Multiplanar, multisequence MR imaging of the cervical spine was performed. No intravenous contrast was administered.  COMPARISON:  Plain films 01/14/2017.  FINDINGS: Alignment: Standard except for 2 mm anterolisthesis C5-6.  Vertebrae: No worrisome osseous lesion.  Cord: No cord compression.  Posterior Fossa, vertebral arteries, paraspinal tissues: Negative. 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  Disc levels:  C2-3: Unremarkable disc space. BILATERAL facet arthropathy. No impingement.  C3-4: Advanced facet arthropathy, worse on the LEFT. Unremarkable disc space. No definite uncinate spurring. Possible LEFT C4 foraminal narrowing.  C4-5: Annular bulge. RIGHT greater than LEFT facet arthropathy. No definite impingement.  C5-6: Trace anterolisthesis, moderate to severe RIGHT facet arthropathy. Annular bulge. RIGHT-sided uncinate spurring. RIGHT C6 nerve root impingement is possible, although there is no significant foraminal narrowing.  C6-7: Annular bulge. Possible central protrusion. Facet arthropathy. No definite impingement.  C7-T1:  Normal.  IMPRESSION: Multilevel spondylosis. Potentially symptomatic RIGHT-sided neural impingement most likely at C5-C6, although the findings at this level are primarily facet related, with 2 mm anterolisthesis, along with minor uncinate spurring, which do not appear to narrow the foramen.  Possible LEFT-sided foraminal narrowing at C3-C4 due to facet arthropathy.   Electronically Signed   By: Staci Righter M.D.   On: 01/26/2017 14:25  She  reports that she has never smoked. She has never used smokeless tobacco. No results for input(s): HGBA1C, LABURIC in the last 8760 hours.  Objective:  VS:  HT:    WT:   BMI:     BP:   HR: bpm  TEMP: ( )  RESP:  Physical Exam  Musculoskeletal:  Inspection reveals no atrophy of the bilateral APB or FDI or hand intrinsics. There is no swelling, color changes, allodynia or dystrophic changes. While there is no allodynia she really will not allow you to move the arm very quickly or without much motion or touching because of increased pain. There is 5 out of 5 strength in the bilateral wrist extension, finger abduction and long finger flexion. There is a equivocal Tinel's test at the right wrist.    Ortho Exam Imaging: No results found.  Past Medical/Family/Surgical/Social History: Medications & Allergies reviewed per EMR Patient Active Problem List   Diagnosis Date Noted  . Status post laminectomy 03/26/2017  . Other spondylosis with radiculopathy, cervical region 03/26/2017    Class: Temporary   Past Medical History:  Diagnosis Date  . Asthma   . Complication of anesthesia    pt told she should always have a "pediatric intubation tube"  . 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 Endometriosis    History reviewed. No pertinent family history. Past Surgical History:  Procedure Laterality Date  . ABDOMINAL HYSTERECTOMY  2000  . APPENDECTOMY     secondary to endometriosis  . LAPAROSCOPIC ENDOMETRIOSIS FULGURATION     pt had 7 surgeries related to endometriosis  . POSTERIOR CERVICAL FUSION/FORAMINOTOMY N/A 03/26/2017   Procedure: Right C5-6 and C6-7 Foraminotomies;  Surgeon: Jessy Oto, MD;  Location: [place] [facility];  Service: Orthopedics;  Laterality: N/A;   Social History   Occupational History  .  Not on file.   Social History Main Topics  . Smoking status: Never Smoker  . Smokeless tobacco: Never Used  . Alcohol use Yes     Comment: very rarely  . Drug use:  No  . Sexual activity: Not on file

## 2017-04-24 ENCOUNTER — Encounter (INDEPENDENT_AMBULATORY_CARE_PROVIDER_SITE_OTHER): Payer: Self-pay | Admitting: Specialist

## 2017-04-24 ENCOUNTER — Ambulatory Visit (INDEPENDENT_AMBULATORY_CARE_PROVIDER_SITE_OTHER): Payer: PRIVATE HEALTH INSURANCE | Admitting: Specialist

## 2017-04-24 VITALS — BP 123/85 | HR 75 | Ht 65.0 in | Wt 138.0 lb

## 2017-04-24 DIAGNOSIS — G5611 Other lesions of median nerve, right upper limb: Secondary | ICD-10-CM

## 2017-04-24 DIAGNOSIS — M25531 Pain in right wrist: Secondary | ICD-10-CM

## 2017-04-24 DIAGNOSIS — R2231 Localized swelling, mass and lump, right upper limb: Secondary | ICD-10-CM

## 2017-04-24 DIAGNOSIS — M961 Postlaminectomy syndrome, not elsewhere classified: Secondary | ICD-10-CM

## 2017-04-24 MED ORDER — DIAZEPAM 5 MG PO TABS: ORAL_TABLET | ORAL | 0 refills | Status: DC

## 2017-04-24 MED ORDER — CAPSAICIN-MENTHOL-METHYL SAL 0.025-1-12 % EX CREA: TOPICAL_CREAM | CUTANEOUS | 2 refills | Status: DC

## 2017-04-24 NOTE — Addendum Note (Signed)
Addended by: Basil Dess on: 04/24/2017 01:32 PM   Modules accepted: Orders

## 2017-04-24 NOTE — Procedures (Signed)
EMG & NCV Findings: All nerve conduction studies (as indicated in the following tables) were within normal limits.    All examined muscles (as indicated in the following table) showed no evidence of electrical instability.    Impression: Essentially NORMAL electrodiagnostic study of the right upper limb.  There is no significant electrodiagnostic evidence of nerve entrapment, brachial plexopathy or cervical radiculopathy.    **As you know, purely sensory or demyelinating radiculopathies and chemical radiculitis may not be detected with this particular electrodiagnostic study. She is only 4 weeks out from cervical surgery. If this is more of a demyelinating radiculopathy this may require quite a bit of time for the C6 nerve root to heal. This would not show up on this electrodiagnostic study.   Recommendations: 1.  Follow-up with referring physician. 2.  Continue current management of symptoms. She is only 4 weeks out from cervical surgery. If this is more of a demyelinating radiculopathy this may require quite a bit of time for the C6 nerve root to heal. This would not show up on this electrodiagnostic study.   Nerve Conduction Studies Anti Sensory Summary Table   Stim Site NR Peak (ms) Norm Peak (ms) P-T Amp (V) Norm P-T Amp Site1 Site2 Delta-P (ms) Dist (cm) Vel (m/s) Norm Vel (m/s)  Right Median Acr Palm Anti Sensory (2nd Digit)  33.5C  Wrist    3.0 <3.6 36.7 >10 Wrist Palm 1.4 0.0    Palm    1.6 <2.0 43.7         Right Radial Anti Sensory (Base 1st Digit)  32.4C  Wrist    2.0 <3.1 27.9  Wrist Base 1st Digit 2.0 0.0    Right Ulnar Anti Sensory (5th Digit)  33C  Wrist    3.0 <3.7 25.3 >15.0 Wrist 5th Digit 3.0 14.0 47 >38   Motor Summary Table   Stim Site NR Onset (ms) Norm Onset (ms) O-P Amp (mV) Norm O-P Amp Site1 Site2 Delta-0 (ms) Dist (cm) Vel (m/s) Norm Vel (m/s)  Right Median Motor (Abd Poll Brev)  31.7C  Wrist    2.8 <4.2 5.1 >5 Elbow Wrist 3.9 21.8 56 >50  Elbow     6.7  4.8         Right Ulnar Motor (Abd Dig Min)  31.3C  Wrist    2.7 <4.2 7.6 >3 B Elbow Wrist 3.0 19.0 63 >53  B Elbow    5.7  7.3  A Elbow B Elbow 1.1 10.5 95 >53  A Elbow    6.8  7.2          EMG   Side Muscle Nerve Root Ins Act Fibs Psw Amp Dur Poly Recrt Int Fraser Din Comment  Right 1stDorInt Ulnar C8-T1 Nml Nml Nml Nml Nml 0 Nml Nml   Right Abd Poll Brev Median C8-T1 Nml Nml Nml Nml Nml 0 Nml Nml   Right ExtDigCom   Nml Nml Nml Nml Nml 0 Nml Nml   Right Triceps Radial C6-7-8 Nml Nml Nml Nml Nml 0 Nml Nml   Right Deltoid Axillary C5-6 Nml Nml Nml Nml Nml 0 Nml Nml     Nerve Conduction Studies Anti Sensory Left/Right Comparison   Stim Site L Lat (ms) R Lat (ms) L-R Lat (ms) L Amp (V) R Amp (V) L-R Amp (%) Site1 Site2 L Vel (m/s) R Vel (m/s) L-R Vel (m/s)  Median Acr Palm Anti Sensory (2nd Digit)  33.5C  Wrist  3.0   36.7  Wrist Palm  Palm  1.6   43.7        Radial Anti Sensory (Base 1st Digit)  32.4C  Wrist  2.0   27.9  Wrist Base 1st Digit     Ulnar Anti Sensory (5th Digit)  33C  Wrist  3.0   25.3  Wrist 5th Digit  47    Motor Left/Right Comparison   Stim Site L Lat (ms) R Lat (ms) L-R Lat (ms) L Amp (mV) R Amp (mV) L-R Amp (%) Site1 Site2 L Vel (m/s) R Vel (m/s) L-R Vel (m/s)  Median Motor (Abd Poll Brev)  31.7C  Wrist  2.8   5.1  Elbow Wrist  56   Elbow  6.7   4.8        Ulnar Motor (Abd Dig Min)  31.3C  Wrist  2.7   7.6  B Elbow Wrist  63   B Elbow  5.7   7.3  A Elbow B Elbow  95   A Elbow  6.8   7.2           Waveforms:

## 2017-04-24 NOTE — Patient Instructions (Signed)
Avoid overhead lifting and overhead use of the arms. Do not lift greater than 5 lbs. Adjust head rest in vehicle to prevent hyperextension if rear ended.   

## 2017-04-24 NOTE — Progress Notes (Signed)
   Post-Op Visit Note   Patient: Marie Richardson           Date of Birth: [birth year]           MRN: [id number] Visit Date: 04/24/2017 PCP: Patient, No Pcp Per   Assessment & Plan: Right median neuropathic pain, mass on plain radiographs. Pain in the right shoulder and neck is better and pain is at the level and distal to the right right mid forearm.  Chief Complaint:  Chief Complaint  Patient presents with  . Neck - Routine Post Op    EMG Review  Incision is healing with mild keloid. Motor intact  Pain with volar flexion of the right wrist and with Tinel's at the right wrist is positive.  ROM of the cervical spine is good. Pain is mainly right hand in the radial 3 fingers. Radiographs from previous office visit shows calcification of soft tissue anterior to the right wrist, this may represent a soft tissue mass or calcification of the soft tissue anterior to the right wrist. Visit Diagnoses:  1. 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 Right wrist pain   3. Mass of right wrist     PlanAvoid overhead lifting and overhead use of the arms. Do not lift greater than 5 lbs. Adjust head rest in vehicle to prevent hyperextension if rear ended.  Follow-Up Instructions: No Follow-up on file.   Orders:  Orders Placed This Encounter  Procedures  . MR Wrist Right w/ contrast   Meds ordered this encounter  Medications  . Capsaicin-Menthol-Methyl Sal (CAPSAICIN-METHYL SAL-MENTHOL) 0.025-1-12 % CREA    Sig: Apply to the right hand BID, keep away from mucous membranes or eye as the active ingredient can cause irritation    Dispense:  2 Tube    Refill:  2    Imaging: No results found.  PMFS History: Patient Active Problem List   Diagnosis Date Noted  . Other spondylosis with radiculopathy, cervical region 03/26/2017    Priority: High    Class: Temporary  . Status post laminectomy 03/26/2017   Past Medical History:  Diagnosis Date  . Asthma   . Complication of anesthesia      pt told she should always have a "pediatric intubation tube"  . 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 Endometriosis     No family history on file.  Past Surgical History:  Procedure Laterality Date  . ABDOMINAL HYSTERECTOMY  2000  . APPENDECTOMY     secondary to endometriosis  . LAPAROSCOPIC ENDOMETRIOSIS FULGURATION     pt had 7 surgeries related to endometriosis  . POSTERIOR CERVICAL FUSION/FORAMINOTOMY N/A 03/26/2017   Procedure: Right C5-6 and C6-7 Foraminotomies;  Surgeon: Jessy Oto, MD;  Location: [place];  Service: Orthopedics;  Laterality: N/A;   Social History   Occupational History  . Not on file.   Social History Main Topics  . Smoking status: Never Smoker  . Smokeless tobacco: Never Used  . Alcohol use Yes     Comment: very rarely  . Drug use: No  . Sexual activity: Not on file

## 2017-04-25 ENCOUNTER — Telehealth (INDEPENDENT_AMBULATORY_CARE_PROVIDER_SITE_OTHER): Payer: Self-pay | Admitting: Specialist

## 2017-04-25 NOTE — Telephone Encounter (Signed)
Patient would like Dr. Louanne Skye to look up Acute Calcification Tendonitis, she has looked this up and she states that she seems to have these sxs.  And she wants to know what Dr. Louanne Skye says about it.

## 2017-04-25 NOTE — Telephone Encounter (Signed)
Patient would like for you to call her.  CB#[phone].  Thank  you

## 2017-04-30 ENCOUNTER — Telehealth (INDEPENDENT_AMBULATORY_CARE_PROVIDER_SITE_OTHER): Payer: Self-pay | Admitting: Specialist

## 2017-04-30 NOTE — Telephone Encounter (Signed)
Patient called asking about her PT, she just had a few questions for you. CB # [phone]

## 2017-05-01 NOTE — Telephone Encounter (Signed)
Hey can you see if [facility] or [facility] has a sooner appt for MRI of the wrist for this patient--she states that her scan is scheduled for 05/10/17 @ [facility] and her F/u appt with Dr. Louanne Skye is 05/08/17.

## 2017-05-05 ENCOUNTER — Ambulatory Visit
Admission: RE | Admit: 2017-05-05 | Discharge: 2017-05-05 | Disposition: A | Discharge status: Home / Self Care | Payer: PRIVATE HEALTH INSURANCE | Source: Ambulatory Visit | Attending: Specialist | Admitting: Specialist

## 2017-05-05 DIAGNOSIS — G5611 Other lesions of median nerve, right upper limb: Secondary | ICD-10-CM

## 2017-05-05 DIAGNOSIS — M25531 Pain in right wrist: Secondary | ICD-10-CM

## 2017-05-06 ENCOUNTER — Telehealth (INDEPENDENT_AMBULATORY_CARE_PROVIDER_SITE_OTHER): Payer: Self-pay | Admitting: Specialist

## 2017-05-06 DIAGNOSIS — M4722 Other spondylosis with radiculopathy, cervical region: Secondary | ICD-10-CM

## 2017-05-06 DIAGNOSIS — M542 Cervicalgia: Secondary | ICD-10-CM

## 2017-05-06 NOTE — Telephone Encounter (Signed)
I called and spoke with patient, she states that she went for MRI on 05/05/17 and took her valium and still was not able to do the scan, they had put her in the small unit and she just couldn't do it.  She needs to get a refill on the valium and to get scheduled for another scan before Wednesday.  I put in for refill of Valium, and transferred her call to [facility] to see if they could get her in for MRI before wednesday

## 2017-05-06 NOTE — Telephone Encounter (Signed)
May wish to see if the study can be done in open MRI. Okay with the valium renewal.

## 2017-05-06 NOTE — Telephone Encounter (Signed)
Patient called asked for a call back concerning her MRI. Patient advised she have not had her MRI yet. Patient said she want to keep her appointment with Dr. Louanne Skye on Wednesday. Patient said she is scheduled for her MRI 05/17/17. The number to contact patient is [phone]

## 2017-05-07 ENCOUNTER — Encounter: Payer: Self-pay | Admitting: Specialist

## 2017-05-07 MED ORDER — DIAZEPAM 5 MG PO TABS: ORAL_TABLET | ORAL | 0 refills | Status: DC

## 2017-05-07 NOTE — Telephone Encounter (Signed)
I called the Valium in to [facility] called patient and advised that this was done, and to make sure she gets a CD of the MRI, otherwise we have no way to review the study.  She states that she understands.

## 2017-05-07 NOTE — Telephone Encounter (Signed)
I called the Valium in to [facility] called patient and advised that this was done, and to make sure she gets a CD of the MRI, otherwise we have no way to review the study.  She states that she understands.

## 2017-05-07 NOTE — Telephone Encounter (Signed)
Pt has appt today at [facility] for MRI and pt is requesting 2 valiums before her appt at 2pm, Can we expedite the meds so she can have them before her appt? Please send to [facility].   FYI: Her appt is at 2p today.

## 2017-05-08 ENCOUNTER — Encounter (INDEPENDENT_AMBULATORY_CARE_PROVIDER_SITE_OTHER): Payer: Self-pay | Admitting: Specialist

## 2017-05-08 ENCOUNTER — Ambulatory Visit (INDEPENDENT_AMBULATORY_CARE_PROVIDER_SITE_OTHER): Payer: PRIVATE HEALTH INSURANCE | Admitting: Specialist

## 2017-05-08 ENCOUNTER — Telehealth (INDEPENDENT_AMBULATORY_CARE_PROVIDER_SITE_OTHER): Payer: Self-pay

## 2017-05-08 VITALS — BP 133/89 | HR 89 | Ht 65.0 in | Wt 138.0 lb

## 2017-05-08 DIAGNOSIS — M65841 Other synovitis and tenosynovitis, right hand: Secondary | ICD-10-CM

## 2017-05-08 DIAGNOSIS — G5601 Carpal tunnel syndrome, right upper limb: Secondary | ICD-10-CM

## 2017-05-08 DIAGNOSIS — M65831 Other synovitis and tenosynovitis, right forearm: Secondary | ICD-10-CM

## 2017-05-08 NOTE — Progress Notes (Signed)
   Office Visit Note   Patient: Marie Richardson           Date of Birth: 1960/08/22           MRN: [id number] Visit Date: 05/08/2017              Requested by: No referring provider defined for this encounter. PCP: Patient, No Pcp Per   Assessment & Plan: Visit Diagnoses:  1. Carpal tunnel syndrome, right upper limb   2. Extensor tenosynovitis of right wrist     Plan:An appt wit Dr. Jodelle Gross for 9 AM Friday  A wrist spling may help decrease use and inflamation of the wrist. Ice helps decrease swelling. Meloxicam 7.5 mg up to Twice a day. Radiographs of your wrist and MRI should go with you to your appt with Dr. Jodelle Gross. Avoid overhead lifting and overhead use of the arms. Do not lift greater than 5 lbs. Adjust head rest in vehicle to prevent hyperextension if rear ended. Start therapy to improve cervical spine endurance and core motor as well as to evaluate and treat wrist tenosynovitis.  Follow-Up Instructions: No Follow-up on file.   Orders:  No orders of the defined types were placed in this encounter.  No orders of the defined types were placed in this encounter.     Procedures: No procedures performed   Clinical Data: No additional findings.   Subjective: Chief Complaint  Patient presents with  . Right Wrist - Follow-up    MRI review    HPI  Review of Systems   Objective: Vital Signs: BP 133/89 (BP Location: Left Arm, Patient Position: Sitting)   Pulse 89   Ht 5\' 5"  (1.651 m)   Wt 138 lb (62.6 kg)   BMI 22.96 kg/m   Physical Exam  Ortho Exam  Specialty Comments:  No specialty comments available.  Imaging: No results found.   PMFS History: Patient Active Problem List   Diagnosis Date Noted  . Other spondylosis with radiculopathy, cervical region 03/26/2017    Priority: High    Class: Temporary  . Status post laminectomy 03/26/2017   Past Medical History:  Diagnosis Date  . Asthma   . Complication of anesthesia    pt told she  should always have a "pediatric intubation tube"  . 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 Endometriosis     No family history on file.  Past Surgical History:  Procedure Laterality Date  . ABDOMINAL HYSTERECTOMY  2000  . APPENDECTOMY     secondary to endometriosis  . LAPAROSCOPIC ENDOMETRIOSIS FULGURATION     pt had 7 surgeries related to endometriosis  . POSTERIOR CERVICAL FUSION/FORAMINOTOMY N/A 03/26/2017   Procedure: Right C5-6 and C6-7 Foraminotomies;  Surgeon: Jessy Oto, MD;  Location: [place];  Service: Orthopedics;  Laterality: N/A;   Social History   Occupational History  . Not on file.   Social History Main Topics  . Smoking status: Never Smoker  . Smokeless tobacco: Never Used  . Alcohol use Yes     Comment: very rarely  . Drug use: No  . Sexual activity: Not on file

## 2017-05-08 NOTE — Patient Instructions (Addendum)
Plan:An appt wit Dr. Jodelle Gross for 9 AM Friday  A wrist spling may help decrease use and inflamation of the wrist. Ice helps decrease swelling. Meloxicam 7.5 mg up to Twice a day. Radiographs of your wrist and MRI should go with you to your appt with Dr. Jodelle Gross. Avoid overhead lifting and overhead use of the arms. Do not lift greater than 5 lbs. Adjust head rest in vehicle to prevent hyperextension if rear ended.  Start therapy to improve cervical spine endurance and core motor as well as to evaluate and treat wrist tenosynovitis.

## 2017-05-08 NOTE — Telephone Encounter (Signed)
We received that MRI report

## 2017-05-08 NOTE — Telephone Encounter (Signed)
Betty with [facility] called stating that patient's MRI for Right Wrist is still in process and has no report.  Cb# is [phone].  Please advise.  Thank You.

## 2017-05-10 ENCOUNTER — Other Ambulatory Visit: Payer: PRIVATE HEALTH INSURANCE

## 2017-05-17 ENCOUNTER — Other Ambulatory Visit: Payer: PRIVATE HEALTH INSURANCE

## 2017-06-06 ENCOUNTER — Ambulatory Visit (INDEPENDENT_AMBULATORY_CARE_PROVIDER_SITE_OTHER): Payer: PRIVATE HEALTH INSURANCE | Admitting: Surgery

## 2017-06-06 DIAGNOSIS — M961 Postlaminectomy syndrome, not elsewhere classified: Secondary | ICD-10-CM

## 2017-06-06 DIAGNOSIS — M25531 Pain in right wrist: Secondary | ICD-10-CM

## 2017-06-06 MED ORDER — TRAMADOL HCL 50 MG PO TABS: 50.0000 mg | ORAL_TABLET | Freq: Three times a day (TID) | ORAL | 0 refills | Status: DC | PRN

## 2017-06-06 MED ORDER — METHOCARBAMOL 500 MG PO TABS: 500.0000 mg | ORAL_TABLET | Freq: Three times a day (TID) | ORAL | 0 refills | Status: DC | PRN

## 2017-06-06 NOTE — Progress Notes (Signed)
   Post-Op Visit Note   Patient: Marie Richardson           Date of Birth: 05-07-1961           MRN: [id number] Visit Date: 06/06/2017 PCP: Patient, No Pcp Per   Assessment & Plan:  Chief Complaint: No chief complaint on file. Patient returns. She is status post right C5-6 and C6-7 foraminotomies by Dr. Louanne Skye 03/26/2017. Patient is also recovering from right hand and wrist surgery by Dr. Jodelle Gross. States that cast was just removed from her right upper extremity. Feels like the weight of the cast caused some discomfort in the right trapezius muscle area.  Overall she feels that her neck is doing well from our surgery.   Visit Diagnoses:  1. Cervical post-laminectomy syndrome   2. Pain in right wrist     Plan: For patient's right-sided trapezius pain I did give her prescriptions for tramadol and Robaxin. Dr. Jodelle Gross who operated on her hand and wrist had given her Norco and she will discontinue that medication.  Follow-up with Dr.nitka in 6 weeks for recheck.  She had questions regarding her right hand and wrist surgery and I advised her to be in contact with Dr. Jodelle Gross who did the surgery to get the appropriate answer to her questions regarding that.  Follow-Up Instructions: Return in about 6 weeks (around 07/18/2017).   Orders:  No orders of the defined types were placed in this encounter.  Meds ordered this encounter  Medications  . traMADol (ULTRAM) 50 MG tablet    Sig: Take 1 tablet (50 mg total) by mouth every 8 (eight) hours as needed.    Dispense:  60 tablet    Refill:  0  . methocarbamol (ROBAXIN) 500 MG tablet    Sig: Take 1 tablet (500 mg total) by mouth every 8 (eight) hours as needed for muscle spasms.    Dispense:  60 tablet    Refill:  0    Imaging: No results found.  PMFS History: Patient Active Problem List   Diagnosis Date Noted  . Status post laminectomy 03/26/2017  . Other spondylosis with radiculopathy, cervical region 03/26/2017    Class:  Temporary   Past Medical History:  Diagnosis Date  . Asthma   . Complication of anesthesia    pt told she should always have a "pediatric intubation tube"  . 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 Endometriosis     No family history on file.  Past Surgical History:  Procedure Laterality Date  . ABDOMINAL HYSTERECTOMY  2000  . APPENDECTOMY     secondary to endometriosis  . LAPAROSCOPIC ENDOMETRIOSIS FULGURATION     pt had 7 surgeries related to endometriosis  . POSTERIOR CERVICAL FUSION/FORAMINOTOMY N/A 03/26/2017   Procedure: Right C5-6 and C6-7 Foraminotomies;  Surgeon: Jessy Oto, MD;  Location: [facility];  Service: Orthopedics;  Laterality: N/A;   Social History   Occupational History  . Not on file.   Social History Main Topics  . Smoking status: Never Smoker  . Smokeless tobacco: Never Used  . Alcohol use Yes     Comment: very rarely  . Drug use: No  . Sexual activity: Not on file   Exam Patient does have tenderness on the right trapezius. Appears that her right hand is healing well. She does have some swelling of the right wrist. No obvious signs of infection.

## 2017-06-21 ENCOUNTER — Telehealth (INDEPENDENT_AMBULATORY_CARE_PROVIDER_SITE_OTHER): Payer: Self-pay | Admitting: Specialist

## 2017-06-21 NOTE — Telephone Encounter (Signed)
Patient would like for you to call her.  CB#[phone].  Thank you.

## 2017-06-24 ENCOUNTER — Telehealth (INDEPENDENT_AMBULATORY_CARE_PROVIDER_SITE_OTHER): Payer: Self-pay | Admitting: Specialist

## 2017-06-24 NOTE — Telephone Encounter (Signed)
I called patient, she states that she has some questions that were not answered by Dr. Jodelle Gross, her hand dr.  She also wanted to know if Dr. Louanne Skye had any appt sooner than 07/19/17, I worked her in on 06/25/17 @ 845

## 2017-06-24 NOTE — Telephone Encounter (Signed)
Please call pt she didn't state what she needed,she has a couple of things to discuss.

## 2017-06-25 ENCOUNTER — Encounter (INDEPENDENT_AMBULATORY_CARE_PROVIDER_SITE_OTHER): Payer: Self-pay | Admitting: Specialist

## 2017-06-25 ENCOUNTER — Ambulatory Visit (INDEPENDENT_AMBULATORY_CARE_PROVIDER_SITE_OTHER): Payer: PRIVATE HEALTH INSURANCE

## 2017-06-25 ENCOUNTER — Ambulatory Visit (INDEPENDENT_AMBULATORY_CARE_PROVIDER_SITE_OTHER): Payer: PRIVATE HEALTH INSURANCE | Admitting: Specialist

## 2017-06-25 VITALS — BP 141/84 | HR 93 | Ht 65.0 in | Wt 138.0 lb

## 2017-06-25 DIAGNOSIS — M4722 Other spondylosis with radiculopathy, cervical region: Secondary | ICD-10-CM

## 2017-06-25 DIAGNOSIS — M542 Cervicalgia: Secondary | ICD-10-CM

## 2017-06-25 DIAGNOSIS — M7541 Impingement syndrome of right shoulder: Secondary | ICD-10-CM

## 2017-06-25 NOTE — Progress Notes (Signed)
Office Visit Note   Patient: Marie Richardson           Date of Birth: August 01, 1961           MRN: [id number] Visit Date: 06/25/2017              Requested by: No referring provider defined for this encounter. PCP: Patient, No Pcp Per   Assessment & Plan: Visit Diagnoses:  1. Neck pain   2. Other spondylosis with radiculopathy, cervical region   3. Impingement syndrome of right shoulder     Plan: We'll have patient continue formal PT for her neck and we'll also have them treat her shoulder as well. Follow-up in a few weeks for recheck. If shoulder continues to be symptomatic we may consider trying subacromial Marcaine/Depo-Medrol injection.   Follow-Up Instructions: Return in about 4 weeks (around 07/23/2017).   Orders:  Orders Placed This Encounter  Procedures  . XR Cervical Spine 2 or 3 views   No orders of the defined types were placed in this encounter.     Procedures: No procedures performed   Clinical Data: No additional findings.   Subjective: Chief Complaint  Patient presents with  . Neck - Follow-up, Routine Post Op    HPI Patient returns for follow-up. She's had previous surgical Dr. Jodelle Gross for her right wrist. Feels like this is causing a lot of pulling in her neck where she has known cervical spondylosis. Also status post right C5-6 and C6-7 foraminotomies 03/26/2017. Right-sided neck pain that extends into the right trapezius muscle spasms. Also having some discomfort in her right shoulder with overhead reaching. Review of Systems No current cardiopulmonary GI GU issues  Objective: Vital Signs: BP (!) 141/84 (BP Location: Left Arm, Patient Position: Sitting)   Pulse 93   Ht 5\' 5"  (1.651 m)   Wt 138 lb (62.6 kg)   BMI 22.96 kg/m   Physical Exam  Constitutional: She is oriented to person, place, and time. She appears well-developed. No distress.  HENT:  Head: Normocephalic and atraumatic.  Eyes: EOM are normal. Pupils are equal, round, and  reactive to light.  Pulmonary/Chest: No respiratory distress.  Musculoskeletal:  Positive right brachial plexus and trapezius tenderness. Right shoulder she has good range of motion but with discomfort reaching overhead. Mild to moderately positive impingement test. Negative drop arm. Good cuff strength.  Neurological: She is alert and oriented to person, place, and time.  Skin: Skin is warm.    Ortho Exam  Specialty Comments:  No specialty comments available.  Imaging: No results found.   PMFS History: Patient Active Problem List   Diagnosis Date Noted  . Status post laminectomy 03/26/2017  . Other spondylosis with radiculopathy, cervical region 03/26/2017    Class: Temporary   Past Medical History:  Diagnosis Date  . Asthma   . Complication of anesthesia    pt told she should always have a "pediatric intubation tube"  . 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 Endometriosis     History reviewed. No pertinent family history.  Past Surgical History:  Procedure Laterality Date  . ABDOMINAL HYSTERECTOMY  2000  . APPENDECTOMY     secondary to endometriosis  . LAPAROSCOPIC ENDOMETRIOSIS FULGURATION     pt had 7 surgeries related to endometriosis  . Right C5-6 and C6-7 Foraminotomies N/A 03/26/2017   Performed by Jessy Oto, MD at [facility]   Occupational History  . Not on file  Tobacco Use  . Smoking status: Never Smoker  . Smokeless tobacco: Never Used  Substance and Sexual Activity  . Alcohol use: Yes    Comment: very rarely  . Drug use: No  . Sexual activity: Not on file

## 2017-07-19 ENCOUNTER — Encounter (INDEPENDENT_AMBULATORY_CARE_PROVIDER_SITE_OTHER): Payer: Self-pay | Admitting: Specialist

## 2017-07-19 ENCOUNTER — Ambulatory Visit (INDEPENDENT_AMBULATORY_CARE_PROVIDER_SITE_OTHER): Payer: PRIVATE HEALTH INSURANCE | Admitting: Specialist

## 2017-07-19 VITALS — BP 129/85 | HR 83 | Ht 65.0 in | Wt 138.0 lb

## 2017-07-19 DIAGNOSIS — M5412 Radiculopathy, cervical region: Secondary | ICD-10-CM

## 2017-07-19 DIAGNOSIS — G5621 Lesion of ulnar nerve, right upper limb: Secondary | ICD-10-CM

## 2017-07-19 NOTE — Patient Instructions (Addendum)
Avoid overhead lifting and overhead use of the arms. Do not lift greater than 5 lbs. Adjust head rest in vehicle to prevent hyperextension if rear ended. Take extra precautions to avoid falling. Occupational therapy for right hand motor strengthening intrinsic and right triceps and wrist flexion strengthening. Avoid overhead lifting and overhead use of the arms. Do not lift greater than 5 lbs. Adjust head rest in vehicle to prevent hyperextension if rear ended. Take extra precautions to avoid falling. Occupational therapy for right hand motor strengthening intrinsic and right triceps and wrist flexion strengthening.

## 2017-07-19 NOTE — Progress Notes (Signed)
Office Visit Note   Patient: Marie Richardson           Date of Birth: February 18, 1961           MRN: [id number] Visit Date: 07/19/2017              Requested by: No referring provider defined for this encounter. PCP: Patient, No Pcp Per   Assessment & Plan: Visit Diagnoses:  1. Right cervical radiculopathy   2. Ulnar neuropathy at wrist, right     Plan: Avoid overhead lifting and overhead use of the arms. Do not lift greater than 5 lbs. Adjust head rest in vehicle to prevent hyperextension if rear ended. Take extra precautions to avoid falling. Occupational therapy for right hand motor strengthening intrinsic and right triceps and wrist flexion strengthening. Avoid overhead lifting and overhead use of the arms. Do not lift greater than 5 lbs. Adjust head rest in vehicle to prevent hyperextension if rear ended. Take extra precautions to avoid falling. Occupational therapy for right hand motor strengthening intrinsic and right triceps and wrist flexion strengthening.  Follow-Up Instructions: Return in about 3 weeks (around 08/09/2017).   Orders:  No orders of the defined types were placed in this encounter.  No orders of the defined types were placed in this encounter.     Procedures: No procedures performed   Clinical Data: No additional findings.   Subjective: Chief Complaint  Patient presents with  . Neck - Follow-up    56 year old female right handed with right sided shoulder and right arm pain she under went a right C5-6 and C6-7. She had right wrist surgery 109/2018. Numbness into the right little and ring finger with burning pain into the right hypothenar eminence and pain occasionally radiating up to thr right shoulder. She is taking gabapentin meloxicam and ambien  She is ambulating well, when weather allows.     Review of Systems  Constitutional: Negative.   HENT: Negative.   Eyes: Negative.   Respiratory: Negative.   Cardiovascular: Negative.     Gastrointestinal: Negative.   Endocrine: Negative.   Genitourinary: Negative.   Musculoskeletal: Negative.   Skin: Negative.   Allergic/Immunologic: Negative.   Neurological: Negative.   Hematological: Negative.   Psychiatric/Behavioral: Negative.      Objective: Vital Signs: BP 129/85 (BP Location: Left Arm, Patient Position: Sitting)   Pulse 83   Ht 5\' 5"  (1.651 m)   Wt 138 lb (62.6 kg)   BMI 22.96 kg/m   Physical Exam  Constitutional: She is oriented to person, place, and time. She appears well-developed and well-nourished.  HENT:  Head: Normocephalic and atraumatic.  Eyes: EOM are normal. Pupils are equal, round, and reactive to light.  Neck: Normal range of motion. Neck supple.  Pulmonary/Chest: Effort normal and breath sounds normal.  Abdominal: Soft. Bowel sounds are normal.  Neurological: She is alert and oriented to person, place, and time.  Skin: Skin is warm and dry.  Psychiatric: She has a normal mood and affect. Her behavior is normal. Judgment and thought content normal.    Back Exam   Tenderness  The patient is experiencing tenderness in the cervical.  Range of Motion  Extension:  70 abnormal  Flexion: normal  Lateral bend right: abnormal  Lateral bend left: abnormal  Rotation right: abnormal  Rotation left: abnormal   Muscle Strength  Right Quadriceps:  5/5  Left Quadriceps:  5/5  Right Hamstrings:  5/5  Left Hamstrings:  5/5   Tests  Straight leg raise right: negative Straight leg raise left: negative  Reflexes  Patellar: normal Achilles: normal Biceps: normal Babinski's sign: normal   Other  Toe walk: normal Heel walk: normal Sensation: normal Gait: normal  Erythema: no back redness Scars: absent  Comments:  Right hand intrinsic weakness and weakness in the triceps.      Specialty Comments:  No specialty comments available.  Imaging: No results found.   PMFS History: Patient Active Problem List   Diagnosis Date  Noted  . Other spondylosis with radiculopathy, cervical region 03/26/2017    Priority: High    Class: Temporary  . Status post laminectomy 03/26/2017   Past Medical History:  Diagnosis Date  . Asthma   . Complication of anesthesia    pt told she should always have a "pediatric intubation tube"  . 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 Endometriosis     History reviewed. No pertinent family history.  Past Surgical History:  Procedure Laterality Date  . ABDOMINAL HYSTERECTOMY  2000  . APPENDECTOMY     secondary to endometriosis  . LAPAROSCOPIC ENDOMETRIOSIS FULGURATION     pt had 7 surgeries related to endometriosis  . POSTERIOR CERVICAL FUSION/FORAMINOTOMY N/A 03/26/2017   Procedure: Right C5-6 and C6-7 Foraminotomies;  Surgeon: Jessy Oto, MD;  Location: [facility];  Service: Orthopedics;  Laterality: N/A;   Social History   Occupational History  . Not on file  Tobacco Use  . Smoking status: Never Smoker  . Smokeless tobacco: Never Used  Substance and Sexual Activity  . Alcohol use: Yes    Comment: very rarely  . Drug use: No  . Sexual activity: Not on file

## 2017-08-23 ENCOUNTER — Telehealth (INDEPENDENT_AMBULATORY_CARE_PROVIDER_SITE_OTHER): Payer: Self-pay | Admitting: Specialist

## 2017-08-23 NOTE — Telephone Encounter (Signed)
Received fax from [facility] stating 2nd request for records. I originally faxed records on 12/17 and got a confirmation, I refaxed records today

## 2017-09-03 ENCOUNTER — Telehealth (INDEPENDENT_AMBULATORY_CARE_PROVIDER_SITE_OTHER): Payer: Self-pay | Admitting: Specialist

## 2017-09-03 NOTE — Telephone Encounter (Signed)
Pt called and would like to speak with christy

## 2017-09-04 NOTE — Telephone Encounter (Signed)
I called and spoke with patient, she wanted to be worked in sooner with Dr. Louanne Skye, I adivsed that I did not have anything sooner however I would put her on the cancellation list and I would call her back if something opens up

## 2017-09-10 ENCOUNTER — Ambulatory Visit (INDEPENDENT_AMBULATORY_CARE_PROVIDER_SITE_OTHER): Payer: PRIVATE HEALTH INSURANCE

## 2017-09-10 ENCOUNTER — Ambulatory Visit (INDEPENDENT_AMBULATORY_CARE_PROVIDER_SITE_OTHER): Payer: PRIVATE HEALTH INSURANCE | Admitting: Specialist

## 2017-09-10 ENCOUNTER — Encounter (INDEPENDENT_AMBULATORY_CARE_PROVIDER_SITE_OTHER): Payer: Self-pay | Admitting: Specialist

## 2017-09-10 VITALS — BP 142/92 | HR 89 | Ht 65.0 in | Wt 138.0 lb

## 2017-09-10 DIAGNOSIS — G5621 Lesion of ulnar nerve, right upper limb: Secondary | ICD-10-CM

## 2017-09-10 DIAGNOSIS — M5412 Radiculopathy, cervical region: Secondary | ICD-10-CM | POA: Diagnosis not present

## 2017-09-10 NOTE — Patient Instructions (Signed)
Avoid overhead lifting and overhead use of the arms. Do not lift greater than 5 lbs. Adjust head rest in vehicle to prevent hyperextension if rear ended. Discontinue the use of the right wrist splint. Dr. Daryll Brod hand specialist.

## 2017-09-10 NOTE — Progress Notes (Signed)
Office Visit Note   Patient: Marie Richardson           Date of Birth: 03-Aug-1961           MRN: [id number] Visit Date: 09/10/2017              Requested by: No referring provider defined for this encounter. PCP: Patient, No Pcp Per   Assessment & Plan: Visit Diagnoses:  1. 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 Cubital tunnel syndrome, right     Plan: Avoid overhead lifting and overhead use of the arms. Do not lift greater than 5 lbs. Adjust head rest in vehicle to prevent hyperextension if rear ended. Discontinue the use of the right wrist splint. Dr. Daryll Brod hand specialist.  Follow-Up Instructions: Return in about 4 weeks (around 10/08/2017).   Orders:  Orders Placed This Encounter  Procedures  . XR Elbow 2 Views Right  . XR Cervical Spine 2 or 3 views  . Ambulatory referral to Hand Surgery   No orders of the defined types were placed in this encounter.     Procedures: No procedures performed   Clinical Data: No additional findings.   Subjective: Chief Complaint  Patient presents with  . Neck - Follow-up  . Right Arm - Follow-up    57 year old right handed female with history of right C5-6 and right C6-7 cervical foraminotomies. She has undergone right wrist surgery with [facility] and right wrist debridement of the right wrist for radographic changes and concern of carpal tunnel increased pressures. She notes right ulnar n. Symptoms of numbness and paresthesias and weakness with Use of the right hand. She has dropped times with the right hand. She does not trust the right hand.    Review of Systems  Constitutional: Negative.  Negative for activity change, appetite change and unexpected weight change.  HENT: Negative for congestion, dental problem, drooling, ear discharge, ear pain, facial swelling, hearing loss, mouth sores, sinus pressure, sinus pain, sneezing, sore throat, tinnitus, trouble swallowing and voice  change.   Eyes: Negative.  Negative for photophobia, pain, discharge, redness, itching and visual disturbance.  Respiratory: Negative.  Negative for apnea, cough, choking, chest tightness, shortness of breath, wheezing and stridor.   Cardiovascular: Negative for chest pain, palpitations and leg swelling.  Gastrointestinal: Negative.  Negative for abdominal distention, abdominal pain, anal bleeding, blood in stool, constipation, diarrhea, nausea, rectal pain and vomiting.  Endocrine: Negative.  Negative for cold intolerance, heat intolerance, polydipsia, polyphagia and polyuria.  Genitourinary: Negative.  Negative for decreased urine volume, difficulty urinating, dyspareunia, dysuria, enuresis, flank pain, frequency, genital sores, hematuria, menstrual problem, pelvic pain and urgency.  Musculoskeletal: Negative.  Negative for arthralgias, back pain, gait problem, joint swelling, myalgias, neck pain and neck stiffness.  Skin: Negative.  Negative for color change, pallor, rash and wound.  Allergic/Immunologic: Negative for environmental allergies, food allergies and immunocompromised state.  Neurological: Negative.  Negative for dizziness, tremors, seizures, syncope, facial asymmetry, speech difficulty, weakness, light-headedness, numbness and headaches.  Hematological: Negative for adenopathy. Does not bruise/bleed easily.  Psychiatric/Behavioral: Negative.  Negative for agitation, behavioral problems, confusion, decreased concentration, dysphoric mood, hallucinations, self-injury, sleep disturbance and suicidal ideas. The patient is not nervous/anxious and is not hyperactive.      Objective: Vital Signs: BP (!) 142/92 (BP Location: Left Arm, Patient Position: Sitting)   Pulse 89   Ht 5\' 5"  (1.651 m)   Wt 138 lb (62.6  kg)   BMI 22.96 kg/m   Physical Exam  Ortho Exam  Specialty Comments:  No specialty comments available.  Imaging: Xr Cervical Spine 2 Or 3 Views  Result Date:  09/10/2017 AP and Lateral of the cervical spine with right spondylosis of the posterior facets right C4-5, C5-6 and C6-7 right foramenotomies.  Xr Elbow 2 Views Right  Result Date: 09/10/2017 Right elbow AP and lateral are normal    PMFS History: Patient Active Problem List   Diagnosis Date Noted  . Other spondylosis with radiculopathy, cervical region 03/26/2017    Priority: High    Class: Temporary  . Status post laminectomy 03/26/2017   Past Medical History:  Diagnosis Date  . Asthma   . Complication of anesthesia    pt told she should always have a "pediatric intubation tube"  . 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 Endometriosis     No family history on file.  Past Surgical History:  Procedure Laterality Date  . ABDOMINAL HYSTERECTOMY  2000  . APPENDECTOMY     secondary to endometriosis  . LAPAROSCOPIC ENDOMETRIOSIS FULGURATION     pt had 7 surgeries related to endometriosis  . POSTERIOR CERVICAL FUSION/FORAMINOTOMY N/A 03/26/2017   Procedure: Right C5-6 and C6-7 Foraminotomies;  Surgeon: Jessy Oto, MD;  Location: [facility];  Service: Orthopedics;  Laterality: N/A;   Social History   Occupational History  . Not on file  Tobacco Use  . Smoking status: Never Smoker  . Smokeless tobacco: Never Used  Substance and Sexual Activity  . Alcohol use: Yes    Comment: very rarely  . Drug use: No  . Sexual activity: Not on file

## 2017-09-12 ENCOUNTER — Ambulatory Visit (INDEPENDENT_AMBULATORY_CARE_PROVIDER_SITE_OTHER): Payer: PRIVATE HEALTH INSURANCE | Admitting: Specialist

## 2017-09-12 ENCOUNTER — Telehealth (INDEPENDENT_AMBULATORY_CARE_PROVIDER_SITE_OTHER): Payer: Self-pay | Admitting: Radiology

## 2017-09-12 NOTE — Telephone Encounter (Signed)
Patient has been sched with D. Kuzma on 09/16/2017 @ 230pm and she states that last night she had a really bad night with the pain in her arm, she states it felt like someone was hitting it. Please advise on any suggestions she can try till she can get in to see Dr. Fredna Dow next week.

## 2017-09-13 NOTE — Telephone Encounter (Signed)
Okay to take medication for pain if she has it. Elevation above the heart. Warm soaks with epsom salts. 15 minutes TID.

## 2017-09-19 HISTORY — PX: ULNAR TUNNEL RELEASE: SHX820

## 2017-10-09 ENCOUNTER — Ambulatory Visit (INDEPENDENT_AMBULATORY_CARE_PROVIDER_SITE_OTHER): Payer: PRIVATE HEALTH INSURANCE | Admitting: Specialist

## 2018-01-02 ENCOUNTER — Ambulatory Visit (INDEPENDENT_AMBULATORY_CARE_PROVIDER_SITE_OTHER): Payer: PRIVATE HEALTH INSURANCE | Admitting: Specialist

## 2018-01-02 ENCOUNTER — Encounter (INDEPENDENT_AMBULATORY_CARE_PROVIDER_SITE_OTHER): Payer: Self-pay | Admitting: Specialist

## 2018-01-02 VITALS — BP 123/77 | HR 80 | Ht 65.0 in | Wt 138.0 lb

## 2018-01-02 DIAGNOSIS — M6281 Muscle weakness (generalized): Secondary | ICD-10-CM | POA: Diagnosis not present

## 2018-01-02 DIAGNOSIS — M542 Cervicalgia: Secondary | ICD-10-CM

## 2018-01-02 DIAGNOSIS — M5010 Cervical disc disorder with radiculopathy, unspecified cervical region: Secondary | ICD-10-CM | POA: Diagnosis not present

## 2018-01-02 DIAGNOSIS — R200 Anesthesia of skin: Secondary | ICD-10-CM | POA: Diagnosis not present

## 2018-01-02 MED ORDER — ALPRAZOLAM 0.5 MG PO TABS: ORAL_TABLET | ORAL | 0 refills | Status: DC
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
## 2018-01-02 NOTE — Patient Instructions (Addendum)
Avoid overhead lifting and overhead use of the arms. Do not lift greater than 5 lbs. Adjust head rest in vehicle to prevent hyperextension if rear ended. Take extra precautions to avoid falling. MRI of the cervical spine ordered due to the fact that I would expect any EMG/NCV will be positive given both cervical spine surgery and right upper extremity nerve release.  Carpal Tunnel Syndrome  Carpal tunnel syndrome is a condition that causes pain in your hand and arm. The carpal tunnel is a narrow area located on the palm side of your wrist. Repeated wrist motion or certain diseases may cause swelling within the tunnel. This swelling pinches the main nerve in the wrist (median nerve). What are the causes? This condition may be caused by:  Repeated wrist motions.  Wrist injuries.  Arthritis.  A cyst or tumor in the carpal tunnel.  Fluid buildup during pregnancy. Sometimes the cause of this condition is not known. What increases the risk? This condition is more likely to develop in:  People who have jobs that cause them to repeatedly move their wrists in the same motion, such as Art gallery manager.  Women.  People with certain conditions, such as: ? Diabetes. ? Obesity. ? An underactive thyroid (hypothyroidism). ? Kidney failure. What are the signs or symptoms? Symptoms of this condition include:  A tingling feeling in your fingers, especially in your thumb, index, and middle fingers.  Tingling or numbness in your hand.  An aching feeling in your entire arm, especially when your wrist and elbow are bent for long periods of time.  Wrist pain that goes up your arm to your shoulder.  Pain that goes down into your palm or fingers.  A weak feeling in your hands. You may have trouble grabbing and holding items. Your symptoms may feel worse during the night. How is this diagnosed? This condition is diagnosed with a medical history and physical exam. You may also have tests,  including:  An electromyogram (EMG). This test measures electrical signals sent by your nerves into the muscles.  X-rays. How is this treated? Treatment for this condition includes:  Lifestyle changes. It is important to stop doing or modify the activity that caused your condition.  Physical or occupational therapy.  Medicines for pain and inflammation. This may include medicine that is injected into your wrist.  A wrist splint.  Surgery. Follow these instructions at home: If you have a splint:   Wear it as told by your health care provider. Remove it only as told by your health care provider.  Loosen the splint if your fingers become numb and tingle, or if they turn cold and blue.  Keep the splint clean and dry. General instructions   Take over-the-counter and prescription medicines only as told by your health care provider.  Rest your wrist from any activity that may be causing your pain. If your condition is work related, talk to your employer about changes that can be made, such as getting a wrist pad to use while typing.  If directed, apply ice to the painful area: ? Put ice in a plastic bag. ? Place a towel between your skin and the bag. ? Leave the ice on for 20 minutes, 2-3 times per day.  Keep all follow-up visits as told by your health care provider. This is important.  Do any exercises as told by your health care provider, physical therapist, or occupational therapist. Contact a health care provider if:  You have new symptoms.  Your pain is not controlled with medicines.  Your symptoms get worse. This information is not intended to replace advice given to you by your health care provider. Make sure you discuss any questions you have with your health care provider. Document Released: 07/20/2000 Document Revised: 12/01/2015 Document Reviewed: 04/03/2017 Elsevier Interactive Patient Education  2017 [employer].

## 2018-01-24 ENCOUNTER — Other Ambulatory Visit (INDEPENDENT_AMBULATORY_CARE_PROVIDER_SITE_OTHER): Payer: Self-pay | Admitting: Specialist

## 2018-01-29 ENCOUNTER — Ambulatory Visit
Admission: RE | Admit: 2018-01-29 | Discharge: 2018-01-29 | Disposition: A | Discharge status: Home / Self Care | Payer: PRIVATE HEALTH INSURANCE | Source: Ambulatory Visit | Attending: Specialist | Admitting: Specialist

## 2018-01-29 DIAGNOSIS — M542 Cervicalgia: Secondary | ICD-10-CM

## 2018-02-13 NOTE — Progress Notes (Signed)
appt 02/19/18

## 2018-02-19 ENCOUNTER — Encounter (INDEPENDENT_AMBULATORY_CARE_PROVIDER_SITE_OTHER): Payer: Self-pay | Admitting: Specialist

## 2018-02-19 ENCOUNTER — Ambulatory Visit (INDEPENDENT_AMBULATORY_CARE_PROVIDER_SITE_OTHER): Payer: PRIVATE HEALTH INSURANCE | Admitting: Specialist

## 2018-02-19 VITALS — BP 132/80 | HR 78 | Ht 65.0 in | Wt 135.0 lb

## 2018-02-19 DIAGNOSIS — M4722 Other spondylosis with radiculopathy, cervical region: Secondary | ICD-10-CM

## 2018-02-19 DIAGNOSIS — Z9889 Other specified postprocedural states: Secondary | ICD-10-CM | POA: Diagnosis not present
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
## 2018-02-19 NOTE — Progress Notes (Signed)
Office Visit Note   Patient: Marie Richardson           Date of Birth: 1960/09/05           MRN: [id number] Visit Date: 02/19/2018              Requested by: No referring provider defined for this encounter. PCP: Wayland Salinas, MD   Assessment & Plan: Visit Diagnoses:  1. Other spondylosis with radiculopathy, cervical region   57 year old female with persistent symptoms into the right upper extremity. She has had right carpal tunnel  release and more recent right cubital tunnel release by DrMarland Kitchen Loma Sousa, H.P. [facility], Now with persistent Pain and numbness right index through little finger. Weakness right arm. She originally had right C5-6 and C6-7 foramenotomies for spondylosis changes 03/2017. Post operative EMG/NCV right arm 04/2017 were  Normal in the area of the right C6 and C7 motor. Dr. Ernestina Patches indicated that the study was early 1 month  Post op so that it may be less sensitive to post surgery changes. A more recent study 08/2017 prior to her  Right cubital tunnel release apparently showed some right C6 findings. The full report is not available. The results of the most recent MRI of the cervical spine show no foramenal stenosis C7 and a worsening  Spondylosis of the right C5-6 facet and significant right C6 foramenal stenosis. Reviewing the study the foramenotomy at C5-6 and C6-7 are present. The right C5-6 foramen shows some persistent lateral foramenal stenosis approximating the right vertebral artery. The sagittal images are not able to show or Demonstrate the degree of foramenal stenosis. I recommend repeating her EMGs and NCV studies using the same EMG/NCV instruments so that a measure of whether she is having worsening C6 radicular findings or improving radicular findings as she is nearing one year post foramenotomies. If there is deteriorating function further decompression either redo foramenotomy or ACDF. Her clinical exam shows  Very little guarding and mild soft tissue  pressure induced discomfort about the C7 and C6 spinous processes on the right side. Motor is intact.     Plan:Avoid overhead lifting and overhead use of the arms. Do not lift greater than 5 lbs. Adjust head rest in vehicle to prevent hyperextension if rear ended. Take extra precautions to avoid falling. Repeat right arm EMG/NCV to assess the right C6 radiculopathy for worsening or stable or improving reinnervation.  Follow-Up Instructions: No follow-ups on file.   Orders:  No orders of the defined types were placed in this encounter.  No orders of the defined types were placed in this encounter.     Procedures: No procedures performed   Clinical Data: No additional findings.   Subjective: Chief Complaint  Patient presents with  . Neck - Follow-up    MRI Review    HPI  Review of Systems   Objective: Vital Signs: BP 132/80 (BP Location: Left Arm, Patient Position: Sitting)   Pulse 78   Ht 5\' 5"  (1.651 m)   Wt 135 lb (61.2 kg)   BMI 22.47 kg/m   Physical Exam  Back Exam   Tenderness  The patient is experiencing tenderness in the cervical.  Range of Motion  Extension: 70  Flexion: 70  Lateral bend right:  60 abnormal  Lateral bend left:  70 abnormal  Rotation right:  70 abnormal  Rotation left: 70   Muscle Strength  Right Quadriceps:  5/5  Left Quadriceps:  5/5  Right Hamstrings:  5/5  Left Hamstrings:  5/5   Tests  Straight leg raise right: negative Straight leg raise left: negative  Reflexes  Biceps: 2/4  Other  Gait: normal  Erythema: no back redness Scars: present  Comments:  Motor with normal shoulder abduction, right wrist dynamic spling intact, testing right biceps strength is normal . The right triceps strength is normal. Right wrist volar and dorsiflexion not evaluated due to splint and wrist interventions. Intrinsic motor right hand, finger abduction is normal. Finger extension strength is normal.      Specialty Comments:    No specialty comments available.  Imaging: No results found.   PMFS History: Patient Active Problem List   Diagnosis Date Noted  . Other spondylosis with radiculopathy, cervical region 03/26/2017    Priority: High    Class: Temporary  . Status post laminectomy 03/26/2017   Past Medical History:  Diagnosis Date  . Asthma   . Complication of anesthesia    pt told she should always have a "pediatric intubation tube"  . 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 Endometriosis     History reviewed. No pertinent family history.  Past Surgical History:  Procedure Laterality Date  . ABDOMINAL HYSTERECTOMY  2000  . APPENDECTOMY     secondary to endometriosis  . LAPAROSCOPIC ENDOMETRIOSIS FULGURATION     pt had 7 surgeries related to endometriosis  . POSTERIOR CERVICAL FUSION/FORAMINOTOMY N/A 03/26/2017   Procedure: Right C5-6 and C6-7 Foraminotomies;  Surgeon: Jessy Oto, MD;  Location: [place];  Service: Orthopedics;  Laterality: N/A;   Social History   Occupational History  . Not on file  Tobacco Use  . Smoking status: Never Smoker  . Smokeless tobacco: Never Used  Substance and Sexual Activity  . Alcohol use: Yes    Comment: very rarely  . Drug use: No  . Sexual activity: Not on file

## 2018-02-25 ENCOUNTER — Telehealth (INDEPENDENT_AMBULATORY_CARE_PROVIDER_SITE_OTHER): Payer: Self-pay | Admitting: Specialist

## 2018-02-25 NOTE — Telephone Encounter (Signed)
I put her on the cancellation list 

## 2018-02-25 NOTE — Telephone Encounter (Signed)
Patient was told to follow up in 3 weeks from 7/17 so around the first week in August. Can you add patient to cancellation list? Her appt is scheduled for 9/5 now. Patients # [id number]

## 2018-03-26 ENCOUNTER — Ambulatory Visit (INDEPENDENT_AMBULATORY_CARE_PROVIDER_SITE_OTHER): Payer: PRIVATE HEALTH INSURANCE | Admitting: Specialist

## 2018-03-26 ENCOUNTER — Encounter (INDEPENDENT_AMBULATORY_CARE_PROVIDER_SITE_OTHER): Payer: Self-pay | Admitting: Specialist

## 2018-03-26 ENCOUNTER — Telehealth (INDEPENDENT_AMBULATORY_CARE_PROVIDER_SITE_OTHER): Payer: Self-pay | Admitting: Specialist

## 2018-03-26 VITALS — BP 134/85 | HR 79 | Ht 65.0 in | Wt 135.0 lb

## 2018-03-26 DIAGNOSIS — M5412 Radiculopathy, cervical region: Secondary | ICD-10-CM

## 2018-03-26 DIAGNOSIS — M4722 Other spondylosis with radiculopathy, cervical region: Secondary | ICD-10-CM | POA: Diagnosis not present

## 2018-03-26 DIAGNOSIS — R200 Anesthesia of skin: Secondary | ICD-10-CM | POA: Diagnosis not present

## 2018-03-26 MED ORDER — METHOCARBAMOL 500 MG PO TABS: 500.0000 mg | ORAL_TABLET | Freq: Three times a day (TID) | ORAL | 0 refills | Status: DC | PRN

## 2018-03-26 MED ORDER — TRAMADOL HCL 50 MG PO TABS: 50.0000 mg | ORAL_TABLET | Freq: Three times a day (TID) | ORAL | 0 refills | Status: DC | PRN

## 2018-03-26 MED ORDER — GABAPENTIN 300 MG PO CAPS
100.0000 mg | ORAL_CAPSULE | Freq: Three times a day (TID) | ORAL | 3 refills | Status: DC
Start: 2018-03-26 — End: 2018-03-26

## 2018-03-26 MED ORDER — GABAPENTIN 300 MG PO CAPS: 300.0000 mg | ORAL_CAPSULE | Freq: Three times a day (TID) | ORAL | 3 refills | Status: DC

## 2018-03-26 NOTE — Telephone Encounter (Signed)
[facility] at [facility] left a voicemail saying clinical information is needed in order to process the urgent request they have received. She would like the info faxed to [phone] attn: case # [id number] (put on cover sheet)  * She did not leave a telephone number or exactly what clinical information was needed, maybe you know what she is regarding?

## 2018-03-26 NOTE — Progress Notes (Signed)
Office Visit Note   Patient: Marie Richardson           Date of Birth: November 06, 1960           MRN: [id number] Visit Date: 03/26/2018              Requested by: Wayland Salinas, MD [address] [place], [place] [address] PCP: Wayland Salinas, MD   Assessment & Plan: Visit Diagnoses:  1. Radiculopathy, cervical region   2. Other spondylosis with radiculopathy, cervical region   3. Right arm numbness   This 57 year old female has persistent complaints of dysfunction in the right upper extremity. She is currently  Undergoing functional splinting to improve right wrist extension. Her exam shows minimal discomfort with ROM of the Cervical spine and improved right arm triceps and biceps strength nearly one year post right sided C5-6 and C6-7 foramenotomies. 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 The EMG changes are concerning with negative post op studies in 04/2017 then suggestion of C6 radiculopathy in February and then more findings of C6 radiculopathy on the most recent study. At this time I recommend a CT scan of the cervical spine to confirm the primarily boney foramenal stenosis at the right C5-6 level. The recent MRI Of the cervical spine also shows the joint line of the right C5-6 facet to be essentially obliterated and apparently autofused. The CT scan should be able to assess this in which case I'm not sure how provocative a spurling test would be. In either case she has been a complex case with symptoms that cross several radiculotomes, so much so that I am inclined to recommend that she have a second opinion concerning any further cervical spine surgery if the CT scan is not convincing of ongoing right C5-6 foramenal stenosis. A selective C6 nerve root block may also help discern how much pain relief we Could expect from any further cervical  decompression of this root. I understand the EMG findings, I do not however understand The persistent ulna digit discomfort and how much relief of discomfort she may obtain with C5-6 ACDF.   Plan: Avoid overhead lifting and overhead use of the arms. Do not lift greater than 5 lbs. Adjust head rest in vehicle to prevent hyperextension if rear ended. Take extra precautions to avoid falling. Will go ahead and schedule you for a second opinion concerning further surgery of the cervical spine.  CT Scan of the neck is ordered to assess the bone detail of the right C5-6 and right C6-7 foramen to determine if A further decompression surgery is needed.   Follow-Up Instructions: Return in about 4 weeks (around 04/23/2018).   Orders:  Orders Placed This Encounter  Procedures  . CT CERVICAL SPINE W WO CONTRAST   No orders of the defined types were placed in this encounter.     Procedures: No procedures performed   Clinical Data: Findings:  Review of this patient's MRI from 01/2018 shows the C5-6 right neuroforamen to be patent and appears more patent then on preoperative MRI imaging from 01/2017. Incidentally,  the intraoperative localization radiograph from 03/2017 Indicates that the surgical marker is localizing to above the C4-5 level and in reviewing the images the marker appears above the C5-6 facet and this is confirmed by the MRI appearance of foramenotomy present at C5-6 on the right side.  Subjective: Chief Complaint  Patient presents with  . Neck - Follow-up    57 year old right handed female presents today with persistent pain and discomfort into the right arm and ulnar 2-3 digits. She does have occasional numbness into the right radial 3 digits but the ulnar discomfort is more  troublesome. She had a right C5-6 and C6-7 foramenotomies done 03/2017 with improved right neck and shoulder pain also has seen improvement in triceps strength and biceps strength but persistent right  ulnar hand pain, radiographs demonstrated a right wrist volar calcific densities.EMGs/NCV done 04/23/2017 were negative for cervical radiculopathy. 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 Post surgery she has worsened hand symptoms in the ulnar distribution and had a second opinion with Dr. Daryll Brod Who concurred with Dr. Loma Sousa that a right elbow ulna nerve decompression was indicated. She underwent the ulnar nerve decompression at the elbow and now is having problems with persistent right arm Dysfunction. 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 She has complaints of numbness and dysesthesias into the right ulnar 3 digits and pain in the right forearm. She has difficulty grasping with the right hand ulnar 3 digits and also is weak in  Finger pincer grasp, mildly positive Froment's sign. EMGs/NCV from 09/2017 prior to ulnar nerve surgery indicated C6 radiculopathy however she had no significant clinical deficits and a more recent EMG/NCV is Indicating findings involving right flexor carpi ulnaris, right biceps, right pronator teres and right paraspinous Muscles.   Review of Systems  Constitutional: Positive for activity change and unexpected weight change.  HENT: Negative.   Eyes: Negative.   Respiratory: Negative.   Cardiovascular: Negative.   Gastrointestinal: Negative.   Endocrine: Negative.   Genitourinary: Negative.   Musculoskeletal: Positive for neck stiffness.  Skin: Negative.  Negative for color change, pallor, rash and wound.  Allergic/Immunologic: Negative.   Neurological: Positive for weakness and numbness.  Hematological: Negative.   Psychiatric/Behavioral: Positive for dysphoric mood. Negative for agitation, behavioral  problems, confusion, decreased concentration, hallucinations, self-injury, sleep disturbance and suicidal ideas. The patient is not nervous/anxious and is not hyperactive.      Objective: Vital Signs: BP 134/85   Pulse 79   Ht 5\' 5"  (1.651 m)   Wt 135 lb (61.2 kg)   BMI 22.47 kg/m   Physical Exam  Constitutional: She is oriented to person, place, and time. She appears well-developed and well-nourished.  HENT:  Head: Normocephalic and atraumatic.  Eyes: Pupils are equal, round, and reactive to light. EOM are normal.  Neck: Normal range of motion. Neck supple.  Pulmonary/Chest: Effort normal and breath sounds normal.  Abdominal: Soft. Bowel sounds are normal.  Neurological: She is alert and oriented to person, place, and time.  Skin: Skin is warm and dry.  Psychiatric: She has a normal mood and affect. Her behavior is normal. Judgment and thought content normal.    Right Hand Exam   Tenderness  The patient is experiencing tenderness in the ulnar area.  Range of Motion  Wrist  Extension:  20 abnormal  Flexion: 50  Pronation: 80  Supination: 80   Muscle Strength  Wrist extension: 5/5  Wrist flexion: 4/5  Grip: 4/5   Tests  Phalen's Sign: negative Tinel's sign (median nerve):  positive Finkelstein's test: negative  Other  Erythema: absent Scars: present Sensation: decreased Pulse: present  Comments:  Right wrist dynamic extension splint.  Weak with thumb-index grasp, abduction weakness Right hand. Weak right ulnar digit finger flexor. Right biceps and triceps strength is 5/5      Specialty Comments:  No specialty comments available.  Imaging: No results found.   PMFS History: Patient Active Problem List   Diagnosis Date Noted  . Other spondylosis with radiculopathy, cervical region 03/26/2017    Priority: High    Class: Temporary  . Status post laminectomy 03/26/2017   Past Medical History:  Diagnosis Date  . Asthma   . Complication of  anesthesia    pt told she should always have a "pediatric intubation tube"  . 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 Endometriosis     History reviewed. No pertinent family history.  Past Surgical History:  Procedure Laterality Date  . ABDOMINAL HYSTERECTOMY  2000  . APPENDECTOMY     secondary to endometriosis  . LAPAROSCOPIC ENDOMETRIOSIS FULGURATION     pt had 7 surgeries related to endometriosis  . POSTERIOR CERVICAL FUSION/FORAMINOTOMY N/A 03/26/2017   Procedure: Right C5-6 and C6-7 Foraminotomies;  Surgeon: Jessy Oto, MD;  Location: [facility];  Service: Orthopedics;  Laterality: N/A;   Social History   Occupational History  . Not on file  Tobacco Use  . Smoking status: Never Smoker  . Smokeless tobacco: Never Used  Substance and Sexual Activity  . Alcohol use: Yes    Comment: very rarely  . Drug use: No  . Sexual activity: Not on file

## 2018-03-26 NOTE — Addendum Note (Signed)
Addended by: Basil Dess on: 03/26/2018 01:20 PM   Modules accepted: Orders

## 2018-03-26 NOTE — Addendum Note (Signed)
Addended by: Basil Dess on: 03/26/2018 11:41 AM   Modules accepted: Orders

## 2018-03-26 NOTE — Patient Instructions (Signed)
Avoid overhead lifting and overhead use of the arms. Do not lift greater than 5 lbs. Adjust head rest in vehicle to prevent hyperextension if rear ended. Take extra precautions to avoid falling. Will go ahead and schedule you for a second opinion concerning further surgery of the cervical spine.  CT Scan of the neck is ordered to assess the bone detail of the right C5-6 and right C6-7 foramen to determine if A further decompression surgery is needed.

## 2018-03-27 ENCOUNTER — Telehealth (INDEPENDENT_AMBULATORY_CARE_PROVIDER_SITE_OTHER): Payer: Self-pay | Admitting: Specialist

## 2018-03-27 NOTE — Telephone Encounter (Signed)
Faxed last OV note as requested for STAT CT scan that was ordered.

## 2018-03-27 NOTE — Telephone Encounter (Signed)
Kennyth Lose at [facility] left a voicemail needing to know where patients pain is at and if it acute or chronic. She is requesting a call back # [phone]

## 2018-03-28 NOTE — Telephone Encounter (Signed)
Called and left VM for pharmacist letting her know this was a chronic issue with the patient

## 2018-04-02 ENCOUNTER — Ambulatory Visit
Admission: RE | Admit: 2018-04-02 | Discharge: 2018-04-02 | Disposition: A | Discharge status: Home / Self Care | Payer: PRIVATE HEALTH INSURANCE | Source: Ambulatory Visit | Attending: Specialist | Admitting: Specialist

## 2018-04-02 ENCOUNTER — Other Ambulatory Visit (INDEPENDENT_AMBULATORY_CARE_PROVIDER_SITE_OTHER): Payer: Self-pay | Admitting: Specialist

## 2018-04-02 DIAGNOSIS — M5412 Radiculopathy, cervical region: Secondary | ICD-10-CM

## 2018-04-03 ENCOUNTER — Other Ambulatory Visit (INDEPENDENT_AMBULATORY_CARE_PROVIDER_SITE_OTHER): Payer: Self-pay | Admitting: Specialist

## 2018-04-04 ENCOUNTER — Telehealth (INDEPENDENT_AMBULATORY_CARE_PROVIDER_SITE_OTHER): Payer: Self-pay | Admitting: Specialist

## 2018-04-04 ENCOUNTER — Ambulatory Visit (INDEPENDENT_AMBULATORY_CARE_PROVIDER_SITE_OTHER): Payer: PRIVATE HEALTH INSURANCE | Admitting: Specialist

## 2018-04-04 ENCOUNTER — Other Ambulatory Visit: Payer: PRIVATE HEALTH INSURANCE

## 2018-04-04 ENCOUNTER — Encounter (INDEPENDENT_AMBULATORY_CARE_PROVIDER_SITE_OTHER): Payer: Self-pay | Admitting: Specialist

## 2018-04-04 VITALS — BP 127/83 | HR 84 | Ht 65.0 in | Wt 135.0 lb

## 2018-04-04 DIAGNOSIS — M4722 Other spondylosis with radiculopathy, cervical region: Secondary | ICD-10-CM

## 2018-04-04 DIAGNOSIS — M542 Cervicalgia: Secondary | ICD-10-CM | POA: Diagnosis not present

## 2018-04-04 NOTE — Patient Instructions (Signed)
Avoid overhead lifting and overhead use of the arms. Do not lift greater than 5 lbs. Adjust head rest in vehicle to prevent hyperextension if rear ended. Take extra precautions to avoid falling. Surgery is indicated due to upper extremity radiculopathy. In the future surgery at adjacent levels may be necessary but these levels do not appear to be related to your current symptoms or signs. A right C6 selective nerve root block is ordered and Dr. Romona Curls secretary will contact you to arrange for the block.

## 2018-04-04 NOTE — Progress Notes (Signed)
   Office Visit Note   Patient: Marie Richardson           Date of Birth: 02-Nov-1960           MRN: [id number] Visit Date: 04/04/2018              Requested by: Wayland Salinas, MD [address] [place], [place] [address] PCP: Wayland Salinas, MD   Assessment & Plan: Visit Diagnoses:  1. Other spondylosis with radiculopathy, cervical region   2. Cervicalgia     Plan: Avoid overhead lifting and overhead use of the arms. Do not lift greater than 5 lbs. Adjust head rest in vehicle to prevent hyperextension if rear ended. Take extra precautions to avoid falling. Surgery is indicated due to upper extremity radiculopathy. In the future surgery at adjacent levels may be necessary but these levels do not appear to be related to your current symptoms or signs. A right C6 selective nerve root block is ordered and Dr. Romona Curls secretary will contact you to arrange for the block.  Follow-Up Instructions: Return in about 4 weeks (around 05/02/2018).   Orders:  Orders Placed This Encounter  Procedures  . Ambulatory referral to Physical Medicine Rehab   No orders of the defined types were placed in this encounter.     Procedures: No procedures performed   Clinical Data: No additional findings.   Subjective: Chief Complaint  Patient presents with  . Neck - Follow-up    CT Review of Cervical spine    HPI  Review of Systems   Objective: Vital Signs: BP 127/83 (BP Location: Left Arm, Patient Position: Sitting)   Pulse 84   Ht 5\' 5"  (1.651 m)   Wt 135 lb (61.2 kg)   BMI 22.47 kg/m   Physical Exam  Ortho Exam  Specialty Comments:  No specialty comments available.  Imaging: No results found.   PMFS History: Patient Active Problem List   Diagnosis Date Noted  . Other spondylosis with radiculopathy, cervical region 03/26/2017    Priority: High    Class: Temporary  . Status post laminectomy 03/26/2017   Past Medical History:    Diagnosis Date  . Asthma   . Complication of anesthesia    pt told she should always have a "pediatric intubation tube"  . 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 Endometriosis     History reviewed. No pertinent family history.  Past Surgical History:  Procedure Laterality Date  . ABDOMINAL HYSTERECTOMY  2000  . APPENDECTOMY     secondary to endometriosis  . LAPAROSCOPIC ENDOMETRIOSIS FULGURATION     pt had 7 surgeries related to endometriosis  . POSTERIOR CERVICAL FUSION/FORAMINOTOMY N/A 03/26/2017   Procedure: Right C5-6 and C6-7 Foraminotomies;  Surgeon: Jessy Oto, MD;  Location: [facility];  Service: Orthopedics;  Laterality: N/A;   Social History   Occupational History  . Not on file  Tobacco Use  . Smoking status: Never Smoker  . Smokeless tobacco: Never Used  Substance and Sexual Activity  . Alcohol use: Yes    Comment: very rarely  . Drug use: No  . Sexual activity: Not on file

## 2018-04-04 NOTE — Telephone Encounter (Signed)
Patient needs an appointment on or around 04/18/18.  She currently has an appointment on October 3 @ 9:00.  Please place her on your cancellation list.  Thank you.

## 2018-04-04 NOTE — Telephone Encounter (Signed)
I put her on the cancellation list 

## 2018-04-08 ENCOUNTER — Telehealth (INDEPENDENT_AMBULATORY_CARE_PROVIDER_SITE_OTHER): Payer: Self-pay | Admitting: *Deleted

## 2018-04-09 ENCOUNTER — Other Ambulatory Visit (INDEPENDENT_AMBULATORY_CARE_PROVIDER_SITE_OTHER): Payer: Self-pay | Admitting: Physical Medicine and Rehabilitation

## 2018-04-09 MED ORDER — TRIAZOLAM 0.25 MG PO TABS: ORAL_TABLET | ORAL | 0 refills | Status: DC

## 2018-04-09 NOTE — Progress Notes (Signed)
Pre-procedure triazalam ordered for pre-operative anxiety.

## 2018-04-10 ENCOUNTER — Ambulatory Visit (INDEPENDENT_AMBULATORY_CARE_PROVIDER_SITE_OTHER): Payer: PRIVATE HEALTH INSURANCE | Admitting: Physical Medicine and Rehabilitation

## 2018-04-10 ENCOUNTER — Encounter (INDEPENDENT_AMBULATORY_CARE_PROVIDER_SITE_OTHER): Payer: Self-pay | Admitting: Physical Medicine and Rehabilitation

## 2018-04-10 ENCOUNTER — Ambulatory Visit (INDEPENDENT_AMBULATORY_CARE_PROVIDER_SITE_OTHER): Payer: PRIVATE HEALTH INSURANCE | Admitting: Specialist

## 2018-04-10 ENCOUNTER — Ambulatory Visit (INDEPENDENT_AMBULATORY_CARE_PROVIDER_SITE_OTHER): Payer: Self-pay

## 2018-04-10 VITALS — BP 123/79 | HR 84

## 2018-04-10 DIAGNOSIS — M5412 Radiculopathy, cervical region: Secondary | ICD-10-CM | POA: Diagnosis not present

## 2018-04-10 MED ORDER — BUPIVACAINE HCL 0.25 % IJ SOLN
2.0000 mL | Freq: Once | INTRAMUSCULAR | Status: AC
  Administered 2018-04-10: 2 mL

## 2018-04-10 NOTE — Progress Notes (Signed)
 .  Numeric Pain Rating Scale and Functional Assessment Average Pain 6   In the last MONTH (on 0-10 scale) has pain interfered with the following?  1. General activity like being  able to carry out your everyday physical activities such as walking, climbing stairs, carrying groceries, or moving a chair?  Rating(6)   +Driver, -BT, -Dye Allergies.  

## 2018-04-10 NOTE — Procedures (Signed)
Cervical Transforaminal Selective Nerve Root Block with Fluoroscopic Guidance   Patient: Marie Richardson      Date of Birth: 10-Sep-1960 MRN: [id number] PCP: Wayland Salinas, MD      Visit Date: 04/10/2018   Universal Protocol:    Date/Time: 09/05/191:55 PM  Consent Given By: the patient  Position: supine  Additional Comments: Vital signs were monitored before and after the procedure. Patient was prepped and draped in the usual sterile fashion. The correct patient, procedure, and site was verified.   Injection Procedure Details:  Procedure Site One Meds Administered:  Meds ordered this encounter  Medications  . bupivacaine (MARCAINE) 0.25 % (with pres) injection 2 mL     Laterality: Right  Location/Site:   C6 nerve root C5-6  Needle size: 25 G  Needle type: spinal needle  Needle Placement:  -Comments: Excellent flow of contrast along the nerve and nerve root. **Patient did not seem to have much relief during the anesthetic portion post procedure.  She will monitor this for a few  Procedure Details: The C-arm was obliqued to the ipsilateral side of the patient to about 45 from AP.  The C-arm was then tilted to square off the inferior endplate of the level targeted.  C-arm obliquity was then adjusted to maximize the size of the foramen.  The skin over the targeted area which was the posterior inferior portion of the foramen was then anesthetized with a small amount of 1% lidocaine without epinephrine.  A 25-gauge 2-1/2 inch spinal needle was then introduced through the skin and down to the targeted area under biplanar fluoroscopic guidance.  Multiple AP and lateral/oblique views were monitored during needle manipulation.  Final placement was with the needle just outside of the 6 o'clock position on the AP view.  After negative aspirate for air, blood, and CSF, a 1 ml. volume of Isovue-250 was injected into the foramen and the flow of contrast was observed. Radiographs  were obtained for documentation purposes.   When there was no arterial or venous flow of contrast, the injectate was administered into the level noted above.     Additional Comments:  The patient tolerated the procedure well No complications occurred Dressing: Band-Aid    Post-procedure details: Patient was observed during the procedure. Post-procedure instructions were reviewed.  Patient left the clinic in stable condition.

## 2018-04-10 NOTE — Patient Instructions (Signed)

## 2018-04-11 ENCOUNTER — Telehealth (INDEPENDENT_AMBULATORY_CARE_PROVIDER_SITE_OTHER): Payer: Self-pay | Admitting: Specialist

## 2018-04-11 NOTE — Progress Notes (Signed)
Marie Richardson - 57 y.o. female MRN [id number]  Date of birth: 01-20-1961  Office Visit Note: Visit Date: 04/10/2018 PCP: Wayland Salinas, MD Referred by: Wayland Salinas, *  Subjective: Chief Complaint  Patient presents with  . Neck - Pain  . Right Arm - Pain, Tingling   HPI: Marie Richardson is a 57 year old right-hand-dominant female who I have seen in the past for right C6 selective nerve root block.  She had good relief of that temporarily went on to have right C5-6 and C6-7 foraminotomies.  She continues to have neck and arm pain with paresthesia and tingling.  She returns today at the request of Dr. Basil Dess for diagnostic selective nerve root block on the right at C6.   ROS Otherwise per HPI.  Assessment & Plan: Visit Diagnoses:  1. Cervical radiculopathy     Plan: No additional findings.   Meds & Orders:  Meds ordered this encounter  Medications  . bupivacaine (MARCAINE) 0.25 % (with pres) injection 2 mL    Orders Placed This Encounter  Procedures  . Nerve Block  . XR C-ARM NO REPORT    Follow-up: Return for Basil Dess, MD.   Procedures: No procedures performed  Cervical Transforaminal Selective Nerve Root Block with Fluoroscopic Guidance   Patient: Marie Richardson      Date of Birth: 55-30-1962 MRN: [id number] PCP: Wayland Salinas, MD      Visit Date: 04/10/2018   Universal Protocol:    Date/Time: 09/05/191:55 PM  Consent Given By: the patient  Position: supine  Additional Comments: Vital signs were monitored before and after the procedure. Patient was prepped and draped in the usual sterile fashion. The correct patient, procedure, and site was verified.   Injection Procedure Details:  Procedure Site One Meds Administered:  Meds ordered this encounter  Medications  . bupivacaine (MARCAINE) 0.25 % (with pres) injection 2 mL     Laterality: Right  Location/Site:   C6 nerve root C5-6  Needle size: 25 G  Needle type:  spinal needle  Needle Placement:  -Comments: Excellent flow of contrast along the nerve and nerve root. **Patient did not seem to have much relief during the anesthetic portion post procedure.  She will monitor this for a few  Procedure Details: The C-arm was obliqued to the ipsilateral side of the patient to about 45 from AP.  The C-arm was then tilted to square off the inferior endplate of the level targeted.  C-arm obliquity was then adjusted to maximize the size of the foramen.  The skin over the targeted area which was the posterior inferior portion of the foramen was then anesthetized with a small amount of 1% lidocaine without epinephrine.  A 25-gauge 2-1/2 inch spinal needle was then introduced through the skin and down to the targeted area under biplanar fluoroscopic guidance.  Multiple AP and lateral/oblique views were monitored during needle manipulation.  Final placement was with the needle just outside of the 6 o'clock position on the AP view.  After negative aspirate for air, blood, and CSF, a 1 ml. volume of Isovue-250 was injected into the foramen and the flow of contrast was observed. Radiographs were obtained for documentation purposes.   When there was no arterial or venous flow of contrast, the injectate was administered into the level noted above.     Additional Comments:  The patient tolerated the procedure well No complications occurred Dressing: Band-Aid    Post-procedure details: Patient was observed during the procedure. Post-procedure  instructions were reviewed.  Patient left the clinic in stable condition.     Clinical History: MRI CERVICAL SPINE WITHOUT CONTRAST  TECHNIQUE: Multiplanar, multisequence MR imaging of the cervical spine was performed. No intravenous contrast was administered.  COMPARISON:  Plain films 01/14/2017.  FINDINGS: Alignment: Standard except for 2 mm anterolisthesis C5-6.  Vertebrae: No worrisome osseous lesion.  Cord: No  cord compression.  Posterior Fossa, vertebral arteries, paraspinal tissues: Negative. 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  Disc levels:  C2-3: Unremarkable disc space. BILATERAL facet arthropathy. No impingement.  C3-4: Advanced facet arthropathy, worse on the LEFT. Unremarkable disc space. No definite uncinate spurring. Possible LEFT C4 foraminal narrowing.  C4-5: Annular bulge. RIGHT greater than LEFT facet arthropathy. No definite impingement.  C5-6: Trace anterolisthesis, moderate to severe RIGHT facet arthropathy. Annular bulge. RIGHT-sided uncinate spurring. RIGHT C6 nerve root impingement is possible, although there is no significant foraminal narrowing.  C6-7: Annular bulge. Possible central protrusion. Facet arthropathy. No definite impingement.  C7-T1:  Normal.  IMPRESSION: Multilevel spondylosis. Potentially symptomatic RIGHT-sided neural impingement most likely at C5-C6, although the findings at this level are primarily facet related, with 2 mm anterolisthesis, along with minor uncinate spurring, which do not appear to narrow the foramen.  Possible LEFT-sided foraminal narrowing at C3-C4 due to facet arthropathy.   Electronically Signed   By: Staci Righter M.D.   On: 01/26/2017 14:25   She reports that she has never smoked. She has never used smokeless tobacco. No results for input(s): HGBA1C, LABURIC in the last 8760 hours.  Objective:  VS:  HT:    WT:   BMI:     BP:123/79  HR:84bpm  TEMP: ( )  RESP:  Physical Exam  Ortho Exam Imaging: Xr C-arm No Report  Result Date: 04/10/2018 Please see Notes tab for imaging impression.   Past Medical/Family/Surgical/Social History: Medications & Allergies reviewed per EMR, new medications updated. Patient Active Problem List   Diagnosis Date Noted  . Status post laminectomy 03/26/2017  . Other spondylosis with radiculopathy, cervical region 03/26/2017    Class:  Temporary   Past Medical History:  Diagnosis Date  . Asthma   . Complication of anesthesia    pt told she should always have a "pediatric intubation tube"  . 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 Endometriosis    History reviewed. No pertinent family history. Past Surgical History:  Procedure Laterality Date  . ABDOMINAL HYSTERECTOMY  2000  . APPENDECTOMY     secondary to endometriosis  . LAPAROSCOPIC ENDOMETRIOSIS FULGURATION     pt had 7 surgeries related to endometriosis  . POSTERIOR CERVICAL FUSION/FORAMINOTOMY N/A 03/26/2017   Procedure: Right C5-6 and C6-7 Foraminotomies;  Surgeon: Jessy Oto, MD;  Location: [facility];  Service: Orthopedics;  Laterality: N/A;   Social History   Occupational History  . Not on file  Tobacco Use  . Smoking status: Never Smoker  . Smokeless tobacco: Never Used  Substance and Sexual Activity  . Alcohol use: Yes    Comment: very rarely  . Drug use: No  . Sexual activity: Not on file

## 2018-04-11 NOTE — Telephone Encounter (Signed)
She is already on the cancellation list

## 2018-04-11 NOTE — Telephone Encounter (Signed)
Patient has an appointment on 04/23/18 and would like to be placed on your cancellation list if anything becomes available before that date.  CB#[phone].  Thank you.

## 2018-04-21 ENCOUNTER — Encounter (INDEPENDENT_AMBULATORY_CARE_PROVIDER_SITE_OTHER): Payer: Self-pay | Admitting: Specialist

## 2018-04-21 ENCOUNTER — Ambulatory Visit (INDEPENDENT_AMBULATORY_CARE_PROVIDER_SITE_OTHER): Payer: PRIVATE HEALTH INSURANCE | Admitting: Specialist

## 2018-04-21 VITALS — BP 143/88 | HR 75 | Ht 65.0 in | Wt 135.0 lb

## 2018-04-21 DIAGNOSIS — M4722 Other spondylosis with radiculopathy, cervical region: Secondary | ICD-10-CM

## 2018-04-21 DIAGNOSIS — G5621 Lesion of ulnar nerve, right upper limb: Secondary | ICD-10-CM | POA: Diagnosis not present

## 2018-04-21 NOTE — Patient Instructions (Addendum)
I will schedule you an appointment to see Dr. Clydell Hakim [facility]. Dr.Harkins is an anesthesiologist and will perform a block of the right arm and we will attempt to determine whether this Relieves your pain. If pain persists despite block of the right arm then the pain generator is likely the neck and surgical solution is reasonable. The right cervical block did not relieve your pain. I understand that you do not remember much due to  The use of periprocedure valium but Dr.Newton's note indicates that during the anesthetic phase of the  Selective bock of C6 that you did not have any significant relief of your right arm symptoms. This is a Response that suggests that performing surgery to decompress the right C6 nerve may not help. Continue the use of gabapentin. Continue with physical therapy.

## 2018-04-23 ENCOUNTER — Ambulatory Visit (INDEPENDENT_AMBULATORY_CARE_PROVIDER_SITE_OTHER): Payer: PRIVATE HEALTH INSURANCE | Admitting: Specialist

## 2018-04-24 ENCOUNTER — Telehealth (INDEPENDENT_AMBULATORY_CARE_PROVIDER_SITE_OTHER): Payer: Self-pay | Admitting: *Deleted

## 2018-04-24 ENCOUNTER — Other Ambulatory Visit (INDEPENDENT_AMBULATORY_CARE_PROVIDER_SITE_OTHER): Payer: Self-pay | Admitting: *Deleted

## 2018-04-24 DIAGNOSIS — M4722 Other spondylosis with radiculopathy, cervical region: Secondary | ICD-10-CM

## 2018-04-24 NOTE — Progress Notes (Signed)
Office Visit Note   Patient: Marie Richardson           Date of Birth: 03/14/1961           MRN: [id number] Visit Date: 04/21/2018              Requested by: Wayland Salinas, MD [id number] [place], [place] [address] PCP: Wayland Salinas, MD   Assessment & Plan: Visit Diagnoses:  1. Other spondylosis with radiculopathy, cervical region   2. Ulnar neuropathy of right upper extremity   The results of the most recent SNRB do not support a right C6 source of the persistent right shoulder pain and I am concerned As I was preoperatively last year that a fusion of C5-6 will potentially lead to symptomatic C4-5 and C3-4 disease. A repeat right C6 posterior decompression and fusion can be done as most of the spondylosis changes appear to be posterior foramen or hypertrophic facet. This can be done with a repeat foramentomy and localized posterior fusion with lateral mass screws and rods at C5-6. The  results of the SNRB though are not supporting of cervical source so I am going to refer Marie Richardson to Dr Clydell Hakim of [facility] for an opinion and consideration of a right above the elbow ulna nerve block. I do not think the concern she has about being over this by December should factor into performing another procedure, thus far I believe she has had enough that without definitive cause of the pain we are treating a further intervention should not be undertaken. She has spondylosis and she likely has residual post surgerical EMG changes. Whether more surgery will help is  not as clear especially in light of the recent SNRB results.   Plan: I will schedule you an appointment to see Dr. [facility]dell [facility]. Dr.Harkins is an anesthesiologist and will perform a block of the right arm and we will attempt to determine whether this Relieves your pain. If pain persists despite block of the right arm then the pain generator is likely the  neck and surgical solution is reasonable. The right cervical block did not relieve your pain. I understand that you do not remember much due to  The use of periprocedure valium but Dr.Newton's note indicates that during the anesthetic phase of the  Selective bock of C6 that you did not have any significant relief of your right arm symptoms. This is a Response that suggests that performing surgery to decompress the right C6 nerve may not help. Continue the use of gabapentin. Continue with physical therapy.  Follow-Up Instructions: Return in about 2 weeks (around 05/05/2018).   Orders:  No orders of the defined types were placed in this encounter.  No orders of the defined types were placed in this encounter.     Procedures: No procedures performed   Clinical Data: No additional findings.   Subjective: Chief Complaint  Patient presents with  . Neck - Follow-up    57 year old female right handed with history of right cervical foramenotomies 03/2017 for spondylosis and right UE pain and weakness improved with cervical SNRB. Improved cervical and right shoulder pain post op but numbness and paresthesias in the ulnar right hand post surgery. Xrays with a calcified mass right volar ulnar hand, MRI with calcified area of the right volar ulnar wrist. She was seen by Dr. Loma Sousa and Underwent a right wrist surgery with debridement of the area of calcified  scar and had repair of a wrist flexor tendon. Post operatively pain has persistent in the distribution of the right ulnar hand and she underwent a second opinion with Dr. Fredna Dow and eventual right cubital tunnel release by Dr. Loma Sousa. EMGs 09/2017 suggested C6 radiculopathy mild. Post op she is using a dynamic splint to regain wrist extension and has been advised that a wrist manipulation under general anesthesia may help to improve the wrist extension. Repeat EMGs/NCV are suggesting right C6 radiculopathy. She is having right ulna hand  and forarm numbness and pain and pain along the ulnar and medial elbow and distal arm. She experiences pain in the right periscapular area and pain in the right posterior inferior neck. 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 CT scan show stenosis. Her strength in biceps and triceps is better than prior to foramenotomies 03/2018.  A C6 selective nerve root block done most recently by Dr. Ernestina Patches Was not successful in relieving her right shoulder or arm pain. I have suggested that a second opinion concerning her cervical spine may be indicated as I am not as certain that another cervical spine surgery Will successfully relieve the bulk of her symptoms in the right arm. She is anxious to have something done So that she can be functioning at a level where she can assist her daughter with new baby expected in  December 2019. I have explained that she should not be placing a time limit on need for intervention or she  May be disappointed with the results.    Review of Systems  Constitutional: Negative.  Negative for activity change, appetite change, chills and unexpected weight change.  HENT: Negative for congestion, dental problem, drooling, ear discharge, rhinorrhea, sinus pressure, sinus pain, sneezing, sore throat, trouble swallowing and voice change.   Eyes: Negative.  Negative for photophobia, pain, discharge, redness, itching and visual disturbance.  Respiratory: Negative.  Negative for apnea, cough, choking, chest tightness, shortness of breath, wheezing and stridor.   Cardiovascular: Negative.  Negative for chest pain, palpitations and leg swelling.  Gastrointestinal: Negative.  Negative for abdominal distention, abdominal pain, anal bleeding, blood in stool and diarrhea.  Endocrine: Negative.  Negative for cold intolerance, heat intolerance, polydipsia, polyphagia and polyuria.  Genitourinary: Negative.  Negative for difficulty urinating, dysuria, enuresis, flank pain, frequency and  hematuria.  Musculoskeletal: Positive for neck pain and neck stiffness. Negative for arthralgias, back pain, gait problem, joint swelling and myalgias.  Skin: Negative.  Negative for color change, pallor, rash and wound.  Allergic/Immunologic: Negative.  Negative for environmental allergies, food allergies and immunocompromised state.  Neurological: Positive for weakness and numbness. Negative for dizziness, tremors, seizures, syncope, facial asymmetry, speech difficulty, light-headedness and headaches.  Hematological: Negative.  Negative for adenopathy. Does not bruise/bleed easily.  Psychiatric/Behavioral: Positive for dysphoric mood. Negative for agitation, behavioral problems, confusion, decreased concentration, hallucinations, self-injury, sleep disturbance and suicidal ideas. The patient is nervous/anxious. The patient is not hyperactive.      Objective: Vital Signs: BP (!) 143/88   Pulse 75   Ht 5\' 5"  (1.651 m)   Wt 135 lb (61.2 kg)   BMI 22.47 kg/m   Physical Exam  Constitutional: She is oriented to person, place, and time. She appears well-developed and well-nourished.  HENT:  Head: Normocephalic and atraumatic.  Eyes: Pupils are equal, round, and reactive to light. EOM are normal.  Neck: Normal range of motion. Neck supple.  Pulmonary/Chest: Effort normal and breath sounds normal.  Abdominal: Soft. Bowel sounds are  normal.  Neurological: She is alert and oriented to person, place, and time.  Skin: Skin is warm and dry.  Psychiatric: She has a normal mood and affect. Her behavior is normal. Judgment and thought content normal.    Back Exam   Tenderness  The patient is experiencing tenderness in the cervical.  Range of Motion  Extension:  60 abnormal  Flexion: 80  Lateral bend right:  70 abnormal  Lateral bend left: normal  Rotation right:  40 abnormal  Rotation left: normal   Muscle Strength  Right Quadriceps:  5/5  Left Quadriceps:  5/5  Right Hamstrings:  5/5   Left Hamstrings:  5/5   Tests  Straight leg raise right: negative Straight leg raise left: negative  Reflexes  Biceps:  Hyporeflexic abnormal  Other  Toe walk: normal Heel walk: normal Sensation: decreased Erythema: no back redness Scars: present  Comments:  Posterior cervical spine scar healed, non tender, some right paracervical muscle atrophy.  spurling sign is mildly posititive  Right biceps strength is 5/5, right triceps strength is 5/5, right finger extension 5/5, finger abduction is 4/5 Difficulty opposing thumb to little finger.    Right Hand Exam   Tenderness  The patient is experiencing tenderness in the palmer area, dorsal area and ulnar area.  Range of Motion  Wrist  Extension:  35 abnormal  Flexion: 70  Pronation: normal  Supination: normal   Muscle Strength  Wrist extension: 5/5  Wrist flexion: 5/5       Specialty Comments:  No specialty comments available.  Imaging: No results found.   PMFS History: Patient Active Problem List   Diagnosis Date Noted  . Other spondylosis with radiculopathy, cervical region 03/26/2017    Priority: High    Class: Temporary  . Status post laminectomy 03/26/2017   Past Medical History:  Diagnosis Date  . Asthma   . Complication of anesthesia    pt told she should always have a "pediatric intubation tube"  . 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 Endometriosis     History reviewed. No pertinent family history.  Past Surgical History:  Procedure Laterality Date  . ABDOMINAL HYSTERECTOMY  2000  . APPENDECTOMY     secondary to endometriosis  . LAPAROSCOPIC ENDOMETRIOSIS FULGURATION     pt had 7 surgeries related to endometriosis  . POSTERIOR CERVICAL FUSION/FORAMINOTOMY N/A 03/26/2017   Procedure: Right C5-6 and C6-7 Foraminotomies;  Surgeon: Jessy Oto, MD;  Location: [facility];  Service: Orthopedics;  Laterality: N/A;   Social History   Occupational History  . Not on file    Tobacco Use  . Smoking status: Never Smoker  . Smokeless tobacco: Never Used  Substance and Sexual Activity  . Alcohol use: Yes    Comment: very rarely  . Drug use: No  . Sexual activity: Not on file

## 2018-04-24 NOTE — Telephone Encounter (Signed)
Pt called wanted to know about her referral to Dr. Hardin Negus with doing the nerve block and I advised her Dr. Louanne Skye has not put the order in yet but I will do ahead and do it and advised her that Dr. Hardin Negus office requires 4-6 weeks for review and then will call to schedule appt because that is their protocol. Pt stated she can not wait that long for an appt to see Dr. Hardin Negus and that she also has a follow up appt with Dr. Louanne Skye on Oct 3 to discuss further options. I advised pt that I will send the referral to Dr. Hardin Negus as urgent in hopes they will expedite to get her scheduled. Pt seems like she did not like what I said as if I didn't know what I doing or saying, I tried to explain to her how things work with the referral, pt did not want to listen to me and asked if Alyse Low could give her a call. I told pt I will let Alyse Low know and she will call her back.   Christy, Can you please call pt in regarding referral and also she stated that since her appointment yesterday she had to take pain medications all day d/t the pain. Thanks  CB# [phone]

## 2018-04-24 NOTE — Telephone Encounter (Signed)
Dr.Nitka has changed the request to go to Dr. Maryjean Ka instead of Dr. Hardin Negus

## 2018-05-08 ENCOUNTER — Encounter (INDEPENDENT_AMBULATORY_CARE_PROVIDER_SITE_OTHER): Payer: Self-pay | Admitting: Specialist

## 2018-05-08 ENCOUNTER — Ambulatory Visit (INDEPENDENT_AMBULATORY_CARE_PROVIDER_SITE_OTHER): Payer: PRIVATE HEALTH INSURANCE | Admitting: Specialist

## 2018-05-08 VITALS — BP 126/78 | HR 83 | Ht 65.0 in | Wt 135.0 lb

## 2018-05-08 DIAGNOSIS — M4722 Other spondylosis with radiculopathy, cervical region: Secondary | ICD-10-CM | POA: Diagnosis not present

## 2018-05-08 DIAGNOSIS — G5621 Lesion of ulnar nerve, right upper limb: Secondary | ICD-10-CM | POA: Diagnosis not present

## 2018-05-08 NOTE — Patient Instructions (Signed)
Plan: Avoid overhead lifting and overhead use of the arms. Do not lift greater than 5 lbs. Adjust head rest in vehicle to prevent hyperextension if rear ended. Take extra precautions to avoid falling.  We are scheduling you for right C6 Selective nerve root block with Dr. Ernestina Patches without the use of valium or premed so  That we can see your response to the block, if this provides relief of the pain in the neck and shoulder then we will schedule surgical  Treatment of the cervical spine at the level of the block.

## 2018-05-08 NOTE — Progress Notes (Addendum)
Office Visit Note   Patient: Marie Richardson           Date of Birth: 01/27/1961           MRN: [id number] Visit Date: 05/08/2018              Requested by: Wayland Salinas, MD [address] [place], [place] [address] PCP: Wayland Salinas, MD   Assessment & Plan: Visit Diagnoses:  1. Other spondylosis with radiculopathy, cervical region   2. Ulnar neuritis, right   57 year old female right hand dominant with history of cervical spondylosis underwent right C5-6 and C6-7  Foraminotomies in 03/2017 and had Post operative severe right ulnar hand pain, numbness and paresthesias. She was found to have a calcified fibrous nodule over the volar ulnar right wrist on radiographs and MRI confirmed possible right wrist ulna and median nerve irritation. She had EMG/NCV post cervical spine surgery that  Was negative for cervical radiculopathy. She underwent an exploration of the right volar wrist with a reconstruction of the FCU by Dr. Jodelle Gross. Persistent pain in the hand and dysfunction and underwent a right cubital tunnel release. She is experiencing persistent numbness in the right ulnar Hand. Repeat EMG/NCV have shown right C6 radiculopathy and she was referred for consideration of further cervical intervention. Her daughter is Expecting and she wishes to be of help with the care this December. Underwent MRI and CT showing right C5-6 foraminal narrowing with severe facet  Arthrosis at C4-5 and C5-6 and left C3-4. The right C6 foramen discribed as being severely stenotic. Her biceps reflex is normal today and she has good biceps strength. The right supra elbow ulnar nerve block done by Dr. Clydell Hakim yesterday made the right ulnar elbow and right hand ulnar  Sided symptoms on the hypothenar area of the ulna palmar hand better but she is still experiencing pain in the right posterior neck, right periscapular areas, SSA and mid superior scapula. A selective nerve root block  done last month by Dr. Ernestina Patches did not seem to provide relief but she was incapacitated by valium taken about the procedure and does not recall any of the details of the procedure or anything about the degree of relief she  May have experienced. I have asked Dr. Ernestina Patches to repeat the right C6 selective block with out any preprocedure medications so the amount of right Shoulder pain relief can be understood if she has substantial relief of the right shoulder pain. Will see her back in 2 weeks for follow up. Discussed with  Dr. Ernestina Patches.   Plan: Avoid overhead lifting and overhead use of the arms. Do not lift greater than 5 lbs. Adjust head rest in vehicle to prevent hyperextension if rear ended. Take extra precautions to avoid falling.  We are scheduling you for right C6 Selective nerve root block with Dr. Ernestina Patches without the use of valium or premed so  That we can see your response to the block, if this provides relief of the pain in the neck and shoulder then we will schedule surgical  Treatment of the cervical spine at the level of the block.   Follow-Up Instructions: No follow-ups on file.   Orders:  No orders of the defined types were placed in this encounter.  No orders of the defined types were placed in this encounter.     Procedures: No procedures performed   Clinical Data: No additional findings.   Subjective: Chief Complaint  Patient presents with  .  Neck - Follow-up  . Right Arm - Follow-up    57 year old female with persistent pain in the right neck and right shoulder and numbness right hand. Numbness is Mainly in the right ulnar hand but the whole right hand doesn't right and there is alittle numbness right thumb and index. No bowel or bladder difficulty. She has difficulty writing with the right hand and gripping and has pain with trying to sleep comfortably. She has no leg weakness.    Review of Systems  Constitutional: Negative.   HENT: Negative.   Eyes:  Negative.   Cardiovascular: Negative.   Gastrointestinal: Negative.   Endocrine: Negative.   Musculoskeletal: Negative.   Skin: Negative.   Allergic/Immunologic: Negative.   Neurological: Negative.   Hematological: Negative.   Psychiatric/Behavioral: Negative.      Objective: Vital Signs: BP 126/78 (BP Location: Left Arm, Patient Position: Sitting)   Pulse 83   Ht 5\' 5"  (1.651 m)   Wt 135 lb (61.2 kg)   BMI 22.47 kg/m   Physical Exam  Constitutional: She is oriented to person, place, and time. She appears well-developed and well-nourished. No distress.  HENT:  Head: Normocephalic and atraumatic.  Right Ear: External ear normal.  Left Ear: External ear normal.  Nose: Nose normal.  Mouth/Throat: Oropharynx is clear and moist. No oropharyngeal exudate.  Eyes: Pupils are equal, round, and reactive to light. Conjunctivae are normal. Right eye exhibits no discharge. Left eye exhibits no discharge. No scleral icterus.  Neck: Neck supple. No JVD present. No tracheal deviation present. No thyromegaly present.  Cardiovascular: Normal rate, regular rhythm, normal heart sounds and intact distal pulses. Exam reveals no gallop and no friction rub.  No murmur heard. Pulmonary/Chest: Effort normal and breath sounds normal.  Abdominal: Soft. Bowel sounds are normal.  Musculoskeletal: She exhibits no edema or deformity.  Lymphadenopathy:    She has no cervical adenopathy.  Neurological: She is alert and oriented to person, place, and time. She displays normal reflexes. No cranial nerve deficit or sensory deficit. She exhibits normal muscle tone. Coordination normal.  Skin: Skin is dry. No rash noted. She is not diaphoretic. No erythema. No pallor.  Psychiatric: She has a normal mood and affect. Her behavior is normal. Judgment and thought content normal.    Back Exam   Tenderness  The patient is experiencing tenderness in the cervical.  Range of Motion  Extension: abnormal  Flexion:  abnormal  Lateral bend right: abnormal  Lateral bend left: normal  Rotation left: normal   Muscle Strength  Right Quadriceps:  5/5  Left Quadriceps:  5/5  Right Hamstrings:  5/5  Left Hamstrings:  5/5   Tests  Straight leg raise right: negative Straight leg raise left: negative  Reflexes  Patellar: normal Achilles: normal Biceps: normal Babinski's sign: normal   Other  Toe walk: normal Heel walk: normal      Specialty Comments:  No specialty comments available.  Imaging: No results found.   PMFS History: Patient Active Problem List   Diagnosis Date Noted  . Other spondylosis with radiculopathy, cervical region 03/26/2017    Priority: High    Class: Temporary  . Status post laminectomy 03/26/2017   Past Medical History:  Diagnosis Date  . Asthma   . Complication of anesthesia    pt told she should always have a "pediatric intubation tube"  . 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 Endometriosis     No family history on  file.  Past Surgical History:  Procedure Laterality Date  . ABDOMINAL HYSTERECTOMY  2000  . APPENDECTOMY     secondary to endometriosis  . LAPAROSCOPIC ENDOMETRIOSIS FULGURATION     pt had 7 surgeries related to endometriosis  . POSTERIOR CERVICAL FUSION/FORAMINOTOMY N/A 03/26/2017   Procedure: Right C5-6 and C6-7 Foraminotomies;  Surgeon: Jessy Oto, MD;  Location: [facility];  Service: Orthopedics;  Laterality: N/A;   Social History   Occupational History  . Not on file  Tobacco Use  . Smoking status: Never Smoker  . Smokeless tobacco: Never Used  Substance and Sexual Activity  . Alcohol use: Yes    Comment: very rarely  . Drug use: No  . Sexual activity: Not on file

## 2018-05-09 ENCOUNTER — Other Ambulatory Visit (INDEPENDENT_AMBULATORY_CARE_PROVIDER_SITE_OTHER): Payer: Self-pay | Admitting: Physical Medicine and Rehabilitation

## 2018-05-12 ENCOUNTER — Encounter (INDEPENDENT_AMBULATORY_CARE_PROVIDER_SITE_OTHER): Payer: Self-pay | Admitting: Physical Medicine and Rehabilitation

## 2018-05-12 ENCOUNTER — Ambulatory Visit (INDEPENDENT_AMBULATORY_CARE_PROVIDER_SITE_OTHER): Payer: PRIVATE HEALTH INSURANCE | Admitting: Physical Medicine and Rehabilitation

## 2018-05-12 ENCOUNTER — Ambulatory Visit (INDEPENDENT_AMBULATORY_CARE_PROVIDER_SITE_OTHER): Payer: Self-pay

## 2018-05-12 VITALS — BP 129/86 | HR 81 | Temp 98.3°F

## 2018-05-12 DIAGNOSIS — M5412 Radiculopathy, cervical region: Secondary | ICD-10-CM | POA: Diagnosis not present

## 2018-05-12 MED ORDER — DEXAMETHASONE SODIUM PHOSPHATE 10 MG/ML IJ SOLN
15.0000 mg | Freq: Once | INTRAMUSCULAR | Status: AC
  Administered 2018-05-12: 15 mg

## 2018-05-12 MED ORDER — BUPIVACAINE HCL 0.25 % IJ SOLN
2.0000 mL | Freq: Once | INTRAMUSCULAR | Status: AC
  Administered 2018-05-12: 2 mL

## 2018-05-12 NOTE — Progress Notes (Signed)
Numeric Pain Rating Scale and Functional Assessment Average Pain 9   In the last MONTH (on 0-10 scale) has pain interfered with the following?  1. General activity like being  able to carry out your everyday physical activities such as walking, climbing stairs, carrying groceries, or moving a chair?  Rating(7)   +Driver, -BT, -Dye Allergies. 

## 2018-05-12 NOTE — Patient Instructions (Signed)

## 2018-05-14 ENCOUNTER — Ambulatory Visit (INDEPENDENT_AMBULATORY_CARE_PROVIDER_SITE_OTHER): Payer: PRIVATE HEALTH INSURANCE | Admitting: Specialist

## 2018-05-14 ENCOUNTER — Encounter (INDEPENDENT_AMBULATORY_CARE_PROVIDER_SITE_OTHER): Payer: Self-pay | Admitting: Specialist

## 2018-05-14 VITALS — BP 140/88 | HR 75 | Ht 65.0 in | Wt 138.0 lb

## 2018-05-14 DIAGNOSIS — G5621 Lesion of ulnar nerve, right upper limb: Secondary | ICD-10-CM | POA: Diagnosis not present

## 2018-05-14 DIAGNOSIS — M4722 Other spondylosis with radiculopathy, cervical region: Secondary | ICD-10-CM

## 2018-05-14 NOTE — Progress Notes (Signed)
Office Visit Note   Patient: Marie Richardson           Date of Birth: 1961-01-24           MRN: [id number] Visit Date: 05/14/2018              Requested by: Wayland Salinas, MD [address] [place], [place] [address] PCP: Wayland Salinas, MD   Assessment & Plan: Visit Diagnoses: No diagnosis found.  Plan: Avoid overhead lifting and overhead use of the arms. Do not lift greater than 5 lbs. Adjust head rest in vehicle to prevent hyperextension if rear ended. Take extra precautions to avoid falling, including use of a cane if you feel weak. Scheduling secretary Kandice Hams. will call you to arrange for surgery for your cervical spine. If you wish a second opinion please let us know and we can arrange for you. If you have worsening arm or leg numbness or weakness please call or go to an ER. We will contact your cardiologist and primary care physicians to seek clearance for your surgery. Surgery will be an posterior foraminotomies bilateral C5-6 level with decompression of the cervical foramen bilateral C5-6 and fusion  bone grafting and rods and screws, Local bone graft as well and allograft bone graft and vivigen.  Risks of surgery include risks of infection, bleeding and risks to the spinal cord and  Surgery is indicated due to upper extremity radiculopathy.In the future surgery at adjacent levels may be necessary but these levels do not appear to be related to your current symptoms or signs.  Follow-Up Instructions: Return in about 3 months (around 08/14/2018).   Orders:  No orders of the defined types were placed in this encounter.  No orders of the defined types were placed in this encounter.     Procedures: No procedures performed   Clinical Data: No additional findings.   Subjective: Chief Complaint  Patient presents with  . Neck - Follow-up    She had a C6 nerve block by Dr. Ernestina Patches on 05/12/18, she states that life was good for a  few hours, but pain cam back raging for the next 2 days but pain is better today.    HPI  Review of Systems   Objective: Vital Signs: BP 140/88 (BP Location: Left Arm, Patient Position: Sitting)   Pulse 75   Ht 5\' 5"  (1.651 m)   Wt 138 lb (62.6 kg)   BMI 22.96 kg/m   Physical Exam  Ortho Exam  Specialty Comments:  No specialty comments available.  Imaging: No results found.   PMFS History: Patient Active Problem List   Diagnosis Date Noted  . Other spondylosis with radiculopathy, cervical region 03/26/2017    Priority: High    Class: Temporary  . Status post laminectomy 03/26/2017   Past Medical History:  Diagnosis Date  . Asthma   . Complication of anesthesia    pt told she should always have a "pediatric intubation tube"  . 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 Endometriosis     History reviewed. No pertinent family history.  Past Surgical History:  Procedure Laterality Date  . ABDOMINAL HYSTERECTOMY  2000  . APPENDECTOMY     secondary to endometriosis  . LAPAROSCOPIC ENDOMETRIOSIS FULGURATION     pt had 7 surgeries related to endometriosis  . POSTERIOR CERVICAL FUSION/FORAMINOTOMY N/A 03/26/2017   Procedure: Right C5-6 and C6-7 Foraminotomies;  Surgeon: Jessy Oto, MD;  Location: [facility];  Service: Orthopedics;  Laterality: N/A;   Social History   Occupational History  . Not on file  Tobacco Use  . Smoking status: Never Smoker  . Smokeless tobacco: Never Used  Substance and Sexual Activity  . Alcohol use: Yes    Comment: very rarely  . Drug use: No  . Sexual activity: Not on file

## 2018-05-14 NOTE — Patient Instructions (Signed)
Avoid overhead lifting and overhead use of the arms. Do not lift greater than 5 lbs. Adjust head rest in vehicle to prevent hyperextension if rear ended. Take extra precautions to avoid falling, including use of a cane if you feel weak. Scheduling secretary Kandice Hams. will call you to arrange for surgery for your cervical spine. If you wish a second opinion please let us know and we can arrange for you. If you have worsening arm or leg numbness or weakness please call or go to an ER. We will contact your cardiologist and primary care physicians to seek clearance for your surgery. Surgery will be an posterior foraminotomies bilateral C5-6 level with decompression of the cervical foramen bilateral C5-6 and fusion  bone grafting and rods and screws, Local bone graft as well and allograft bone graft and vivigen.  Risks of surgery include risks of infection, bleeding and risks to the spinal cord and  Surgery is indicated due to upper extremity radiculopathy.In the future surgery at adjacent levels may be necessary but these levels do not appear to be related to your current symptoms or signs.

## 2018-05-14 NOTE — Addendum Note (Signed)
Addended by: Basil Dess on: 05/14/2018 01:09 PM   Modules accepted: Orders

## 2018-05-16 NOTE — Procedures (Signed)
Cervical Transforaminal Selective Nerve Root Block with Fluoroscopic Guidance   Patient: Marie Richardson      Date of Birth: 12/30/1960 MRN: [id number] PCP: Wayland Salinas, MD      Visit Date: 05/12/2018   Universal Protocol:    Date/Time: 10/11/195:56 AM  Consent Given By: the patient  Position: supine  Additional Comments: Vital signs were monitored before and after the procedure. Patient was prepped and draped in the usual sterile fashion. The correct patient, procedure, and site was verified.   Injection Procedure Details:  Procedure Site One Meds Administered:  Meds ordered this encounter  Medications  . dexamethasone (DECADRON) injection 15 mg  . bupivacaine (MARCAINE) 0.25 % (with pres) injection 2 mL     Laterality: Right  Location/Site:  C5-6  Needle size: 25 G  Needle type: spinal needle  Needle Placement: under the pedicle  Findings:    -Comments: Excellent flow of contrast along the nerve root without any intravascular flow.  Procedure Details: The C-arm was obliqued to the ipsilateral side of the patient to about 45 from AP.  The C-arm was then tilted to square off the inferior endplate of the level targeted.  C-arm obliquity was then adjusted to maximize the size of the foramen.  The skin over the targeted area which was the posterior inferior portion of the foramen was then anesthetized with a small amount of 1% lidocaine without epinephrine.  A 25-gauge 2-1/2 inch spinal needle was then introduced through the skin and down to the targeted area under biplanar fluoroscopic guidance.  Multiple AP and lateral/oblique views were monitored during needle manipulation.  Final placement was with the needle just outside of the 6 o'clock position on the AP view.  After negative aspirate for air, blood, and CSF, a 1 ml. volume of Isovue-250 was injected into the foramen and the flow of contrast was observed. Radiographs were obtained for documentation  purposes.   When there was no arterial or venous flow of contrast, the injectate was administered into the level noted above.     Additional Comments:  The patient tolerated the procedure well Dressing: Band-Aid    Post-procedure details: Patient was observed during the procedure. Post-procedure instructions were reviewed.   Patient left the clinic in stable condition.

## 2018-05-16 NOTE — Progress Notes (Signed)
Marie Richardson - 57 y.o. female MRN [id number]  Date of birth: 05-07-1961  Office Visit Note: Visit Date: 05/12/2018 PCP: Wayland Salinas, MD Referred by: Wayland Salinas, *  Subjective: Chief Complaint  Patient presents with  . Neck - Pain  . Right Shoulder - Pain  . Right Arm - Pain   HPI:  Marie Richardson is a 57 y.o. female who comes in today For planned right C6 selective nerve root block.  Patient has had prior foraminotomy at this level with continued pain.  Prior C6 selective nerve root block was difficult to assess as the patient reported too much sedation with oral sedation that was provided.  Patient is here for repeat diagnostic selective nerve root block at C6.  She has not had any preprocedure oral sedation.  ROS Otherwise per HPI.  Assessment & Plan: Visit Diagnoses:  1. Cervical radiculopathy     Plan: No additional findings.   Meds & Orders:  Meds ordered this encounter  Medications  . dexamethasone (DECADRON) injection 15 mg  . bupivacaine (MARCAINE) 0.25 % (with pres) injection 2 mL    Orders Placed This Encounter  Procedures  . Nerve Block  . XR C-ARM NO REPORT    Follow-up: Return if symptoms worsen or fail to improve, for Basil Dess, MD.   Procedures: No procedures performed  Cervical Transforaminal Selective Nerve Root Block with Fluoroscopic Guidance   Patient: Marie Richardson      Date of Birth: 1961-06-05 MRN: [id number] PCP: Wayland Salinas, MD      Visit Date: 05/12/2018   Universal Protocol:    Date/Time: 10/11/195:56 AM  Consent Given By: the patient  Position: supine  Additional Comments: Vital signs were monitored before and after the procedure. Patient was prepped and draped in the usual sterile fashion. The correct patient, procedure, and site was verified.   Injection Procedure Details:  Procedure Site One Meds Administered:  Meds ordered this encounter  Medications  . dexamethasone (DECADRON)  injection 15 mg  . bupivacaine (MARCAINE) 0.25 % (with pres) injection 2 mL     Laterality: Right  Location/Site:  C5-6  Needle size: 25 G  Needle type: spinal needle  Needle Placement: under the pedicle  Findings:    -Comments: Excellent flow of contrast along the nerve root without any intravascular flow.  Procedure Details: The C-arm was obliqued to the ipsilateral side of the patient to about 45 from AP.  The C-arm was then tilted to square off the inferior endplate of the level targeted.  C-arm obliquity was then adjusted to maximize the size of the foramen.  The skin over the targeted area which was the posterior inferior portion of the foramen was then anesthetized with a small amount of 1% lidocaine without epinephrine.  A 25-gauge 2-1/2 inch spinal needle was then introduced through the skin and down to the targeted area under biplanar fluoroscopic guidance.  Multiple AP and lateral/oblique views were monitored during needle manipulation.  Final placement was with the needle just outside of the 6 o'clock position on the AP view.  After negative aspirate for air, blood, and CSF, a 1 ml. volume of Isovue-250 was injected into the foramen and the flow of contrast was observed. Radiographs were obtained for documentation purposes.   When there was no arterial or venous flow of contrast, the injectate was administered into the level noted above.     Additional Comments:  The patient tolerated the procedure well Dressing: Band-Aid  Post-procedure details: Patient was observed during the procedure. Post-procedure instructions were reviewed.   Patient left the clinic in stable condition.     Clinical History: MRI CERVICAL SPINE WITHOUT CONTRAST  TECHNIQUE: Multiplanar, multisequence MR imaging of the cervical spine was performed. No intravenous contrast was administered.  COMPARISON:  Plain films 01/14/2017.  FINDINGS: Alignment: Standard except for 2 mm  anterolisthesis C5-6.  Vertebrae: No worrisome osseous lesion.  Cord: No cord compression.  Posterior Fossa, vertebral arteries, paraspinal tissues: Negative. 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  Disc levels:  C2-3: Unremarkable disc space. BILATERAL facet arthropathy. No impingement.  C3-4: Advanced facet arthropathy, worse on the LEFT. Unremarkable disc space. No definite uncinate spurring. Possible LEFT C4 foraminal narrowing.  C4-5: Annular bulge. RIGHT greater than LEFT facet arthropathy. No definite impingement.  C5-6: Trace anterolisthesis, moderate to severe RIGHT facet arthropathy. Annular bulge. RIGHT-sided uncinate spurring. RIGHT C6 nerve root impingement is possible, although there is no significant foraminal narrowing.  C6-7: Annular bulge. Possible central protrusion. Facet arthropathy. No definite impingement.  C7-T1:  Normal.  IMPRESSION: Multilevel spondylosis. Potentially symptomatic RIGHT-sided neural impingement most likely at C5-C6, although the findings at this level are primarily facet related, with 2 mm anterolisthesis, along with minor uncinate spurring, which do not appear to narrow the foramen.  Possible LEFT-sided foraminal narrowing at C3-C4 due to facet arthropathy.   Electronically Signed   By: Staci Righter M.D.   On: 01/26/2017 14:25     Objective:  VS:  HT:    WT:   BMI:     BP:129/86  HR:81bpm  TEMP:98.3 F (36.8 C)(Oral)  RESP:  Physical Exam  Ortho Exam Imaging: No results found.

## 2018-06-02 ENCOUNTER — Telehealth (INDEPENDENT_AMBULATORY_CARE_PROVIDER_SITE_OTHER): Payer: Self-pay

## 2018-06-02 NOTE — Telephone Encounter (Signed)
Patient left message wanting to schedule surgery.  Need surgery sheet.  I called her back and left voice mail for return call.

## 2018-06-02 NOTE — Telephone Encounter (Signed)
Spoke with patient, will work on getting clearances.  Need surgery sheet.  Patient wants you to address any and all levels that might require surgery so she doesn't have to go back to OR later.

## 2018-06-06 ENCOUNTER — Telehealth (INDEPENDENT_AMBULATORY_CARE_PROVIDER_SITE_OTHER): Payer: Self-pay | Admitting: Specialist

## 2018-06-06 NOTE — Telephone Encounter (Signed)
Patient called wanted to know could a stronger muscle relaxer be called in ---looks like she was taking Methocarbamol 500mg  last.  Please advise

## 2018-06-06 NOTE — Telephone Encounter (Signed)
Patient called wanted to know could a stronger muscle relaxer be called in

## 2018-06-16 NOTE — Pre-Procedure Instructions (Signed)
Marie Richardson  06/16/2018    Your procedure is scheduled on Friday, June 20, 2018 at 1:00 PM.   Report to [facility] Entrance "A" Admitting Office at 11:00 AM.   Call this number if you have problems the morning of surgery: [phone]   Questions prior to day of surgery, please call [phone] between 8 & 4 PM.   Remember:  Do not eat or drink after midnight Thursday, 06/19/18.  Take these medicines the morning of surgery with A SIP OF WATER: Gabapentin (Neurontin), Tramadol - if needed, Albuterol inhaler - if needed (bring inhaler with you day of surgery)  Stop NSAIDS (Mobic, Ibuprofen, Aleve, etc) and Multivitamins as of today prior to surgery.    Do not wear jewelry, make-up or nail polish.  Do not wear lotions, powders, perfumes or deodorant.  Do not shave 48 hours prior to surgery.    Do not bring valuables to the hospital.  [facility] is not responsible for any belongings or valuables.  Contacts, dentures or bridgework may not be worn into surgery.  Leave your suitcase in the car.  After surgery it may be brought to your room.  For patients admitted to the hospital, discharge time will be determined by your treatment team.  Patients discharged the day of surgery will not be allowed to drive home.   [facility] - Preparing for Surgery  Before surgery, you can play an important role.  Because skin is not sterile, your skin needs to be as free of germs as possible.  You can reduce the number of germs on you skin by washing with CHG (chlorahexidine gluconate) soap before surgery.  CHG is an antiseptic cleaner which kills germs and bonds with the skin to continue killing germs even after washing.  Oral Hygiene is also important in reducing the risk of infection.  Remember to brush your teeth with your regular toothpaste the morning of surgery.  Please DO NOT use if you have an allergy to CHG or antibacterial soaps.  If your skin becomes reddened/irritated stop  using the CHG and inform your nurse when you arrive at Short Stay.  Do not shave (including legs and underarms) for at least 48 hours prior to the first CHG shower.  You may shave your face.  Please follow these instructions carefully:   1.  Shower with CHG Soap the night before surgery and the morning of Surgery.  2.  If you choose to wash your hair, wash your hair first as usual with your normal shampoo.  3.  After you shampoo, rinse your hair and body thoroughly to remove the shampoo. 4.  Use CHG as you would any other liquid soap.  You can apply chg directly to the skin and wash gently with a      scrungie or washcloth.           5.  Apply the CHG Soap to your body ONLY FROM THE NECK DOWN.   Do not use on open wounds or open sores. Avoid contact with your eyes, ears, mouth and genitals (private parts).  Wash genitals (private parts) with your normal soap.  6.  Wash thoroughly, paying special attention to the area where your surgery will be performed.  7.  Thoroughly rinse your body with warm water from the neck down.  8.  DO NOT shower/wash with your normal soap after using and rinsing off the CHG Soap.  9.  Pat yourself dry with a clean towel.  10.  Wear clean pajamas.            11.  Place clean sheets on your bed the night of your first shower and do not sleep with pets.  Day of Surgery  Shower as above. Do not apply any lotions/deodorants the morning of surgery.   Please wear clean clothes to the hospital. Remember to brush your teeth with toothpaste.   Please read over the fact sheets that you were given.

## 2018-06-16 NOTE — Pre-Procedure Instructions (Signed)
Donalee Gaumond  06/16/2018      [facility] [place], [place] - [place], [id number], [address] [place] [address] Phone: [phone] Fax: [phone]    Your procedure is scheduled on November 15th.  Report to [facility] Admitting at 1100 A.M.  Call this number if you have problems the morning of surgery:  [phone]   Remember:  Do not eat or drink after midnight.    Take these medicines the morning of surgery with A SIP OF WATER   albuterol (PROVENTIL HFA;VENTOLIN HFA) if needed. Bring Inhaler with you  gabapentin (NEURONTIN)  methocarbamol (ROBAXIN) if needed  traMADol (ULTRAM) if needed   7 days prior to surgery STOP taking any meloxicam (MOBIC), Aspirin(unless otherwise instructed by your surgeon), Aleve, Naproxen, Ibuprofen, Motrin, Advil, Goody's, BC's, all herbal medications, fish oil, and all vitamins   Do not wear jewelry, make-up or nail polish.  Do not wear lotions, powders, or perfumes, or deodorant.  Do not shave 48 hours prior to surgery.  Men may shave face and neck.  Do not bring valuables to the hospital.  [facility] is not responsible for any belongings or valuables.  Contacts, dentures or bridgework may not be worn into surgery.  Leave your suitcase in the car.  After surgery it may be brought to your room.  For patients admitted to the hospital, discharge time will be determined by your treatment team.  Patients discharged the day of surgery will not be allowed to drive home.    [facility]- Preparing For Surgery  Before surgery, you can play an important role. Because skin is not sterile, your skin needs to be as free of germs as possible. You can reduce the number of germs on your skin by washing with CHG (chlorahexidine gluconate) Soap before surgery.  CHG is an antiseptic cleaner which kills germs and bonds with the skin to continue killing germs even after washing.    Oral Hygiene is  also important to reduce your risk of infection.  Remember - BRUSH YOUR TEETH THE MORNING OF SURGERY WITH YOUR REGULAR TOOTHPASTE  Please do not use if you have an allergy to CHG or antibacterial soaps. If your skin becomes reddened/irritated stop using the CHG.  Do not shave (including legs and underarms) for at least 48 hours prior to first CHG shower. It is OK to shave your face.  Please follow these instructions carefully.   1. Shower the NIGHT BEFORE SURGERY and the MORNING OF SURGERY with CHG.   2. If you chose to wash your hair, wash your hair first as usual with your normal shampoo.  3. After you shampoo, rinse your hair and body thoroughly to remove the shampoo.  4. Use CHG as you would any other liquid soap. You can apply CHG directly to the skin and wash gently with a scrungie or a clean washcloth.   5. Apply the CHG Soap to your body ONLY FROM THE NECK DOWN.  Do not use on open wounds or open sores. Avoid contact with your eyes, ears, mouth and genitals (private parts). Wash Face and genitals (private parts)  with your normal soap.  6. Wash thoroughly, paying special attention to the area where your surgery will be performed.  7. Thoroughly rinse your body with warm water from the neck down.  8. DO NOT shower/wash with your normal soap after using and rinsing off the CHG Soap.  9. Pat yourself  dry with a CLEAN TOWEL.  10. Wear CLEAN PAJAMAS to bed the night before surgery, wear comfortable clothes the morning of surgery  11. Place CLEAN SHEETS on your bed the night of your first shower and DO NOT SLEEP WITH PETS.    Day of Surgery:  Do not apply any deodorants/lotions.  Please wear clean clothes to the hospital/surgery center.   Remember to brush your teeth WITH YOUR REGULAR TOOTHPASTE.    Please read over the following fact sheets that you were given.

## 2018-06-17 ENCOUNTER — Encounter (HOSPITAL_COMMUNITY)
Admission: RE | Admit: 2018-06-17 | Discharge: 2018-06-17 | Disposition: A | Discharge status: Home / Self Care | Payer: PRIVATE HEALTH INSURANCE | Source: Ambulatory Visit | Attending: Specialist | Admitting: Specialist

## 2018-06-17 ENCOUNTER — Encounter (HOSPITAL_COMMUNITY): Payer: Self-pay

## 2018-06-17 ENCOUNTER — Other Ambulatory Visit: Payer: Self-pay

## 2018-06-17 DIAGNOSIS — E785 Hyperlipidemia, unspecified: Secondary | ICD-10-CM

## 2018-06-17 DIAGNOSIS — M2578 Osteophyte, vertebrae: Secondary | ICD-10-CM | POA: Diagnosis not present

## 2018-06-17 DIAGNOSIS — M542 Cervicalgia: Secondary | ICD-10-CM | POA: Diagnosis present

## 2018-06-17 DIAGNOSIS — Z01812 Encounter for preprocedural laboratory examination: Secondary | ICD-10-CM

## 2018-06-17 DIAGNOSIS — Z79899 Other long term (current) drug therapy: Secondary | ICD-10-CM | POA: Insufficient documentation

## 2018-06-17 DIAGNOSIS — M50121 Cervical disc disorder at C4-C5 level with radiculopathy: Secondary | ICD-10-CM | POA: Diagnosis not present

## 2018-06-17 DIAGNOSIS — J45909 Unspecified asthma, uncomplicated: Secondary | ICD-10-CM | POA: Diagnosis not present

## 2018-06-17 DIAGNOSIS — M4722 Other spondylosis with radiculopathy, cervical region: Secondary | ICD-10-CM | POA: Diagnosis not present

## 2018-06-17 DIAGNOSIS — M4802 Spinal stenosis, cervical region: Secondary | ICD-10-CM | POA: Diagnosis not present

## 2018-06-17 DIAGNOSIS — M47812 Spondylosis without myelopathy or radiculopathy, cervical region: Secondary | ICD-10-CM

## 2018-06-17 DIAGNOSIS — Z79890 Hormone replacement therapy (postmenopausal): Secondary | ICD-10-CM | POA: Diagnosis not present

## 2018-06-17 DIAGNOSIS — Z791 Long term (current) use of non-steroidal anti-inflammatories (NSAID): Secondary | ICD-10-CM | POA: Diagnosis not present

## 2018-06-17 DIAGNOSIS — E7800 Pure hypercholesterolemia, unspecified: Secondary | ICD-10-CM | POA: Insufficient documentation

## 2018-06-17 HISTORY — DX: Pneumonia, unspecified organism: J18.9

## 2018-06-17 HISTORY — DX: Nausea with vomiting, unspecified: R11.2

## 2018-06-17 HISTORY — DX: Other specified postprocedural states: Z98.890

## 2018-06-17 HISTORY — DX: Pure hypercholesterolemia, unspecified: E78.00

## 2018-06-17 HISTORY — DX: Unspecified osteoarthritis, unspecified site: M19.90

## 2018-06-17 LAB — CBC
HCT: 42 % (ref 36.0–46.0)
Hemoglobin: 13.8 g/dL (ref 12.0–15.0)
MCH: 32.2 pg (ref 26.0–34.0)
MCHC: 32.9 g/dL (ref 30.0–36.0)
MCV: 98.1 fL (ref 80.0–100.0)
Platelets: 330 10*3/uL (ref 150–400)
RBC: 4.28 MIL/uL (ref 3.87–5.11)
RDW: 11.9 % (ref 11.5–15.5)
WBC: 7.4 10*3/uL (ref 4.0–10.5)
nRBC: 0 % (ref 0.0–0.2)

## 2018-06-17 LAB — COMPREHENSIVE METABOLIC PANEL
ALT: 19 U/L (ref 0–44)
ANION GAP: 4 — AB (ref 5–15)
AST: 22 U/L (ref 15–41)
Albumin: 4.6 g/dL (ref 3.5–5.0)
Alkaline Phosphatase: 53 U/L (ref 38–126)
BUN: 7 mg/dL (ref 6–20)
CO2: 29 mmol/L (ref 22–32)
Calcium: 9.7 mg/dL (ref 8.9–10.3)
Chloride: 106 mmol/L (ref 98–111)
Creatinine, Ser: 0.61 mg/dL (ref 0.44–1.00)
GFR calc Af Amer: >60 mL/min (ref >60)
GFR calc non Af Amer: >60 mL/min (ref >60)
GLUCOSE: 88 mg/dL (ref 70–99)
POTASSIUM: 3.7 mmol/L (ref 3.5–5.1)
SODIUM: 139 mmol/L (ref 135–145)
Total Bilirubin: 0.5 mg/dL (ref 0.3–1.2)
Total Protein: 7.6 g/dL (ref 6.5–8.1)

## 2018-06-17 LAB — URINALYSIS, ROUTINE W REFLEX MICROSCOPIC
Bilirubin Urine: NEGATIVE
GLUCOSE, UA: NEGATIVE mg/dL
HGB URINE DIPSTICK: NEGATIVE
Ketones, ur: NEGATIVE mg/dL
Leukocytes, UA: NEGATIVE
Nitrite: NEGATIVE
PROTEIN: NEGATIVE mg/dL
SPECIFIC GRAVITY, URINE: 1.003 — AB (ref 1.005–1.030)
pH: 6 (ref 5.0–8.0)

## 2018-06-17 LAB — APTT: APTT: 30 s (ref 24–36)

## 2018-06-17 LAB — SURGICAL PCR SCREEN
MRSA, PCR: NEGATIVE
Staphylococcus aureus: NEGATIVE

## 2018-06-17 LAB — PROTIME-INR
INR: 0.91
Prothrombin Time: 12.1 seconds (ref 11.4–15.2)

## 2018-06-17 NOTE — Progress Notes (Signed)
Pt states she had some chest pain back in September and saw a cardiologist. She has a strong family hx of CAD. Pt had a stress test done (in [place]) and states nothing was found and told to follow up as needed. Pt denies being diabetic and does not have HTN, states she was hypertensive during her pregnancies.

## 2018-06-18 ENCOUNTER — Encounter (INDEPENDENT_AMBULATORY_CARE_PROVIDER_SITE_OTHER): Payer: Self-pay | Admitting: Surgery

## 2018-06-18 ENCOUNTER — Ambulatory Visit (INDEPENDENT_AMBULATORY_CARE_PROVIDER_SITE_OTHER): Payer: PRIVATE HEALTH INSURANCE | Admitting: Surgery

## 2018-06-18 VITALS — Ht 65.0 in | Wt 147.0 lb

## 2018-06-18 DIAGNOSIS — M5412 Radiculopathy, cervical region: Secondary | ICD-10-CM

## 2018-06-18 MED ORDER — METHOCARBAMOL 500 MG PO TABS: 500.0000 mg | ORAL_TABLET | Freq: Four times a day (QID) | ORAL | 0 refills | Status: DC | PRN

## 2018-06-18 MED ORDER — OXYCODONE-ACETAMINOPHEN 5-325 MG PO TABS: 1.0000 | ORAL_TABLET | Freq: Four times a day (QID) | ORAL | 0 refills | Status: DC | PRN

## 2018-06-18 NOTE — Progress Notes (Signed)
57 year old female history of C4-C6 HNP/stenosis comes in for preop evaluation.  Symptoms unchanged.  Today full history physical performed.  Wants to proceed with surgery as scheduled this Friday.  All questions answered

## 2018-06-18 NOTE — Progress Notes (Signed)
Anesthesia Chart Review:  Case:  [id number] Date/Time:  06/20/18 1245   Procedure:  ANTERIOR CERVICAL DISCECTOMY AND FUSION C4-5 AND C5-6 WITH PLATES, SCREWS, ALLOGRAFT BONE GRAFT AND LOCAL BONE GRAFT (N/A )   Anesthesia type:  General   Pre-op diagnosis:  C4-5, C5-6 cervical spondylosis   Location:  [facility] OR ROOM 06 / [place] [facility]   Surgeon:  Jessy Oto, MD      DISCUSSION: 57 yo female never smoker. Pertinent hx includes PONV, Hx of difficult intubation, Asthma, HLD.  Pt recently had exercise stress echo 05/08/2018 due to family hx of CAD. Results show normal stress echocardiogram with no ischemia suggested.  Pt reports history of difficult intubation. She had cervical surgery 03/26/2017. Per Anesthesia airway note, ETT inserted without complication: Induction Type: IV induction Ventilation: Mask ventilation without difficulty Laryngoscope Size: Glidescope and 3 Grade View: Grade I Tube size: 6.5 mm Number of attempts: 1 Airway Equipment and Method: Patient positioned with wedge pillow,  Stylet and Video-laryngoscopy Placement Confirmation: ETT inserted through vocal cords under direct vision,  positive ETCO2 and breath sounds checked- equal and bilateral Secured at: 21 cm Dental Injury: Teeth and Oropharynx as per pre-operative assessment  Future Recommendations: Recommend- induction with short-acting agent, and alternative techniques readily available  Anticipate she can proceed as planned barring acute status change.  VS: BP 127/83   Pulse 82   Resp 20   Ht 5\' 5"  (1.651 m)   Wt 66.7 kg   SpO2 100%   BMI 24.46 kg/m   PROVIDERS: Ryter-Brown, Shyrl Numbers, MD is PCP  Gabrielle Dare, MD is Cardiologist  LABS: Labs reviewed: Acceptable for surgery. (all labs ordered are listed, but only abnormal results are displayed)  Labs Reviewed  COMPREHENSIVE METABOLIC PANEL - Abnormal; Notable for the following components:      Result Value   Anion gap 4 (*)    All other components within  normal limits  URINALYSIS, ROUTINE W REFLEX MICROSCOPIC - Abnormal; Notable for the following components:   Color, Urine COLORLESS (*)    Specific Gravity, Urine 1.003 (*)    All other components within normal limits  SURGICAL PCR SCREEN  APTT  CBC  PROTIME-INR     IMAGES: CT C-spine 04/02/2018 IMPRESSION: The dominant RIGHT-sided abnormality is at C5-C6, where moderate to severe foraminal narrowing appears primarily related to facet overgrowth, although uncinate spurring is secondary. Good general agreement with prior MR. Status post RIGHT C6 laminotomy.  Asymmetric facet arthropathy and uncinate spurring also noted at C4-5 on the RIGHT, not clearly compressive.  LEFT-sided facet arthropathy and uncinate spurring at C3-4 results in LEFT C4 foraminal.   EKG: 04/18/2018 (care everywhere, narrative only): Sinus rhythm. Rate 78.  03/22/2017: NSR. Rate 78.  CV: Stress echo 05/08/2018 (care everywhere): Rest  ECG  Normal sinus rhythm.  Stress  Stress Type: Exercise  Peak HR: 146 bpm                          HR Response: Normal  Peak BP: 145/90 mmHg                      BP Response: Normal  Predicted HR: 165 bpm                     HR BP Product: 21170  % of predicted HR: 88  Max Exercise: 8.3 METS  Reason for Termination: Target heart rate                                            Exercise Effort: Excellent  Stress Interpretation  Normal resting left ventricular function, with no obvious resting segmental  abnormality.  Normal hypercontractile response throughout, with no obvious induced wall  motion abnormality.  Appropriate hemodynamic response to exercise. No significant ST T wave  changes with exercise.  Results  Global LVEF (rest): Normal (LVEF >50%)  Global LVEF (stress): Hyperkinetic (LVEF >70%)  ECG  No ST-T wave changes.  Arrhythmias  No rhythm abnormality.  Symptoms  No cardiovascular symptoms with maximal exercise.  Past Medical  History:  Diagnosis Date  . Arthritis    neck  . Asthma   . Complication of anesthesia    pt told she should always have a "pediatric intubation tube"  . 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 Endometriosis   . High cholesterol   . Pneumonia   . PONV (postoperative nausea and vomiting)     Past Surgical History:  Procedure Laterality Date  . ABDOMINAL HYSTERECTOMY  2000  . APPENDECTOMY     secondary to endometriosis  . CARPAL TUNNEL RELEASE Right   . COLONOSCOPY    . LAPAROSCOPIC ENDOMETRIOSIS FULGURATION     pt had 7 surgeries related to endometriosis  . POSTERIOR CERVICAL FUSION/FORAMINOTOMY N/A 03/26/2017   Procedure: Right C5-6 and C6-7 Foraminotomies;  Surgeon: Jessy Oto, MD;  [facility]  Service: [facility]opedics;  Laterality: N/A;  . ULNAR TUNNEL RELEASE Right 09/19/2017    MEDICATIONS: . albuterol (PROVENTIL HFA;VENTOLIN HFA) 108 (90 Base) MCG/ACT inhaler  . ALPRAZolam (XANAX) 0.5 MG tablet  . Capsaicin-Menthol-Methyl Sal (CAPSAICIN-METHYL SAL-MENTHOL) 0.025-1-12 % CREA  . Cholecalciferol (VITAMIN D3) 125 MCG (5000 UT) CAPS  . diclofenac (CATAFLAM) 50 MG tablet  . diphenhydrAMINE (BENADRYL) 25 mg capsule  . docusate sodium (COLACE) 100 MG capsule  . estradiol (ESTRACE) 2 MG tablet  . Evolocumab 140 MG/ML SOAJ  . gabapentin (NEURONTIN) 300 MG capsule  . meloxicam (MOBIC) 15 MG tablet  . methocarbamol (ROBAXIN) 500 MG tablet  . methocarbamol (ROBAXIN) 500 MG tablet  . Multiple Vitamins-Minerals (MULTIVITAMIN GUMMIES WOMENS PO)  . oxyCODONE-acetaminophen (PERCOCET/ROXICET) 5-325 MG tablet  . traMADol (ULTRAM) 50 MG tablet  . zolpidem (AMBIEN) 10 MG tablet   No current facility-administered medications for this encounter.      Wynonia Musty [facility] Short Stay Center/Anesthesiology Phone [phone] 06/18/2018 10:32 AM

## 2018-06-18 NOTE — H&P (View-Only) (Signed)
57 year old female history of C4-C6 HNP/stenosis comes in for preop evaluation.  Symptoms unchanged.  Today full history physical performed.  Wants to proceed with surgery as scheduled this Friday.  All questions answered

## 2018-06-18 NOTE — Anesthesia Preprocedure Evaluation (Addendum)
Anesthesia Evaluation  Patient identified by MRN, date of birth, ID band Patient awake    Reviewed: Allergy & Precautions, NPO status , Patient's Chart, lab work & pertinent test results  History of Anesthesia Complications (+) PONV and DIFFICULT AIRWAYNegative for: history of anesthetic complications  Airway Mallampati: IV  TM Distance: >3 FB Neck ROM: Full    Dental no notable dental hx.    Pulmonary asthma (very mild, has not used inhaler in years) ,    Pulmonary exam normal        Cardiovascular negative cardio ROS Normal cardiovascular exam     Neuro/Psych negative neurological ROS  negative psych ROS   GI/Hepatic negative GI ROS, Neg liver ROS,   Endo/Other  negative endocrine ROS  Renal/GU negative Renal ROS  negative genitourinary   Musculoskeletal  (+) Arthritis ,   Abdominal   Peds  Hematology negative hematology ROS (+)   Anesthesia Other Findings   Reproductive/Obstetrics                            Anesthesia Physical Anesthesia Plan  ASA: II  Anesthesia Plan: General   Post-op Pain Management:    Induction: Intravenous  PONV Risk Score and Plan: 4 or greater and Ondansetron, Dexamethasone, Midazolam, Scopolamine patch - Pre-op and Treatment may vary due to age or medical condition  Airway Management Planned: Oral ETT  Additional Equipment: None  Intra-op Plan:   Post-operative Plan: Extubation in OR  Informed Consent: I have reviewed the patients History and Physical, chart, labs and discussed the procedure including the risks, benefits and alternatives for the proposed anesthesia with the patient or authorized representative who has indicated his/her understanding and acceptance.     Plan Discussed with:   Anesthesia Plan Comments: (See PAT note  06/17/2018 by Karoline Caldwell, PA-C )       Anesthesia Quick Evaluation

## 2018-06-20 ENCOUNTER — Ambulatory Visit (HOSPITAL_COMMUNITY): Payer: PRIVATE HEALTH INSURANCE

## 2018-06-20 ENCOUNTER — Ambulatory Visit (HOSPITAL_COMMUNITY): Payer: PRIVATE HEALTH INSURANCE | Admitting: Certified Registered Nurse Anesthetist

## 2018-06-20 ENCOUNTER — Ambulatory Visit (HOSPITAL_COMMUNITY)
Admission: RE | Disposition: A | Discharge status: Home Health | Payer: Self-pay | Source: Ambulatory Visit | Attending: Specialist

## 2018-06-20 ENCOUNTER — Encounter (HOSPITAL_COMMUNITY): Payer: Self-pay | Admitting: *Deleted

## 2018-06-20 ENCOUNTER — Observation Stay (HOSPITAL_COMMUNITY)
Admission: RE | Admit: 2018-06-20 | Discharge: 2018-06-21 | Disposition: A | Discharge status: Home Health | Payer: PRIVATE HEALTH INSURANCE | Source: Ambulatory Visit | Attending: Specialist | Admitting: Specialist

## 2018-06-20 ENCOUNTER — Ambulatory Visit (HOSPITAL_COMMUNITY): Payer: PRIVATE HEALTH INSURANCE | Admitting: Physician Assistant

## 2018-06-20 ENCOUNTER — Other Ambulatory Visit: Payer: Self-pay

## 2018-06-20 DIAGNOSIS — M4802 Spinal stenosis, cervical region: Secondary | ICD-10-CM | POA: Insufficient documentation

## 2018-06-20 DIAGNOSIS — Z981 Arthrodesis status: Secondary | ICD-10-CM | POA: Insufficient documentation

## 2018-06-20 DIAGNOSIS — M50121 Cervical disc disorder at C4-C5 level with radiculopathy: Principal | ICD-10-CM | POA: Insufficient documentation

## 2018-06-20 DIAGNOSIS — M4722 Other spondylosis with radiculopathy, cervical region: Secondary | ICD-10-CM | POA: Diagnosis not present

## 2018-06-20 DIAGNOSIS — M2578 Osteophyte, vertebrae: Secondary | ICD-10-CM | POA: Insufficient documentation

## 2018-06-20 DIAGNOSIS — Z79890 Hormone replacement therapy (postmenopausal): Secondary | ICD-10-CM | POA: Insufficient documentation

## 2018-06-20 DIAGNOSIS — Z419 Encounter for procedure for purposes other than remedying health state, unspecified: Secondary | ICD-10-CM

## 2018-06-20 DIAGNOSIS — Z791 Long term (current) use of non-steroidal anti-inflammatories (NSAID): Secondary | ICD-10-CM | POA: Insufficient documentation

## 2018-06-20 DIAGNOSIS — J45909 Unspecified asthma, uncomplicated: Secondary | ICD-10-CM | POA: Insufficient documentation

## 2018-06-20 DIAGNOSIS — Z79899 Other long term (current) drug therapy: Secondary | ICD-10-CM | POA: Insufficient documentation

## 2018-06-20 HISTORY — PX: ANTERIOR CERVICAL DECOMP/DISCECTOMY FUSION: SHX1161

## 2018-06-20 SURGERY — ANTERIOR CERVICAL DECOMPRESSION/DISCECTOMY FUSION 2 LEVELS
Anesthesia: General

## 2018-06-20 MED ORDER — CHLORHEXIDINE GLUCONATE 4 % EX LIQD: 60.0000 mL | Freq: Once | CUTANEOUS | Status: DC

## 2018-06-20 MED ORDER — SCOPOLAMINE 1 MG/3DAYS TD PT72
MEDICATED_PATCH | TRANSDERMAL | Status: DC | PRN
  Administered 2018-06-20: 1 via TRANSDERMAL

## 2018-06-20 MED ORDER — FENTANYL CITRATE (PF) 100 MCG/2ML IJ SOLN
INTRAMUSCULAR | Status: AC
  Filled 2018-06-20: qty 2

## 2018-06-20 MED ORDER — SODIUM CHLORIDE 0.9% FLUSH: 3.0000 mL | INTRAVENOUS | Status: DC | PRN

## 2018-06-20 MED ORDER — MENTHOL 3 MG MT LOZG: 1.0000 | LOZENGE | OROMUCOSAL | Status: DC | PRN

## 2018-06-20 MED ORDER — ROCURONIUM BROMIDE 50 MG/5ML IV SOSY
PREFILLED_SYRINGE | INTRAVENOUS | Status: AC
  Filled 2018-06-20: qty 5

## 2018-06-20 MED ORDER — ONDANSETRON HCL 4 MG/2ML IJ SOLN
INTRAMUSCULAR | Status: AC
  Filled 2018-06-20: qty 2

## 2018-06-20 MED ORDER — SODIUM CHLORIDE 0.9% FLUSH
3.0000 mL | Freq: Two times a day (BID) | INTRAVENOUS | Status: DC
  Administered 2018-06-20: 3 mL via INTRAVENOUS

## 2018-06-20 MED ORDER — SODIUM CHLORIDE 0.9 % IV SOLN
INTRAVENOUS | Status: DC | PRN
  Administered 2018-06-20: 20 ug/min via INTRAVENOUS

## 2018-06-20 MED ORDER — THROMBIN 20000 UNITS EX SOLR
CUTANEOUS | Status: DC | PRN
  Administered 2018-06-20: 16:00:00 via TOPICAL

## 2018-06-20 MED ORDER — ONDANSETRON HCL 4 MG/2ML IJ SOLN: 4.0000 mg | Freq: Once | INTRAMUSCULAR | Status: DC | PRN

## 2018-06-20 MED ORDER — PHENOL 1.4 % MT LIQD: 1.0000 | OROMUCOSAL | Status: DC | PRN

## 2018-06-20 MED ORDER — FENTANYL CITRATE (PF) 100 MCG/2ML IJ SOLN
INTRAMUSCULAR | Status: DC | PRN
  Administered 2018-06-20: 50 ug via INTRAVENOUS
  Administered 2018-06-20: 100 ug via INTRAVENOUS

## 2018-06-20 MED ORDER — DEXAMETHASONE SODIUM PHOSPHATE 10 MG/ML IJ SOLN
INTRAMUSCULAR | Status: DC | PRN
  Administered 2018-06-20: 10 mg via INTRAVENOUS

## 2018-06-20 MED ORDER — BUPIVACAINE LIPOSOME 1.3 % IJ SUSP
INTRAMUSCULAR | Status: DC | PRN
  Administered 2018-06-20: 20 mL

## 2018-06-20 MED ORDER — DOCUSATE SODIUM 100 MG PO CAPS
100.0000 mg | ORAL_CAPSULE | Freq: Two times a day (BID) | ORAL | Status: DC
  Administered 2018-06-20 – 2018-06-21 (×2): 100 mg via ORAL
  Filled 2018-06-20: qty 1

## 2018-06-20 MED ORDER — OXYCODONE HCL 5 MG/5ML PO SOLN: 5.0000 mg | Freq: Once | ORAL | Status: DC | PRN

## 2018-06-20 MED ORDER — FLEET ENEMA 7-19 GM/118ML RE ENEM: 1.0000 | ENEMA | Freq: Once | RECTAL | Status: DC | PRN

## 2018-06-20 MED ORDER — PROPOFOL 10 MG/ML IV BOLUS
INTRAVENOUS | Status: DC | PRN
  Administered 2018-06-20: 150 mg via INTRAVENOUS

## 2018-06-20 MED ORDER — LIDOCAINE 2% (20 MG/ML) 5 ML SYRINGE
INTRAMUSCULAR | Status: DC | PRN
  Administered 2018-06-20: 80 mg via INTRAVENOUS
  Administered 2018-06-20: 20 mg via INTRAVENOUS

## 2018-06-20 MED ORDER — GABAPENTIN 300 MG PO CAPS
300.0000 mg | ORAL_CAPSULE | Freq: Three times a day (TID) | ORAL | Status: DC
  Administered 2018-06-20 – 2018-06-21 (×2): 300 mg via ORAL
  Filled 2018-06-20 (×2): qty 1

## 2018-06-20 MED ORDER — ROCURONIUM BROMIDE 10 MG/ML (PF) SYRINGE
PREFILLED_SYRINGE | INTRAVENOUS | Status: DC | PRN
  Administered 2018-06-20 (×2): 20 mg via INTRAVENOUS
  Administered 2018-06-20: 5 mg via INTRAVENOUS
  Administered 2018-06-20: 50 mg via INTRAVENOUS
  Administered 2018-06-20: 5 mg via INTRAVENOUS

## 2018-06-20 MED ORDER — DEXMEDETOMIDINE HCL 200 MCG/2ML IV SOLN
INTRAVENOUS | Status: DC | PRN
  Administered 2018-06-20 (×2): 8 ug via INTRAVENOUS
  Administered 2018-06-20: 4 ug via INTRAVENOUS

## 2018-06-20 MED ORDER — OXYCODONE HCL 5 MG PO TABS
10.0000 mg | ORAL_TABLET | ORAL | Status: DC | PRN
  Administered 2018-06-20 – 2018-06-21 (×4): 10 mg via ORAL
  Filled 2018-06-20 (×3): qty 2

## 2018-06-20 MED ORDER — ESTRADIOL 2 MG PO TABS
2.0000 mg | ORAL_TABLET | Freq: Every day | ORAL | Status: DC
  Administered 2018-06-20: 2 mg via ORAL
  Filled 2018-06-20: qty 1

## 2018-06-20 MED ORDER — VITAMIN D 25 MCG (1000 UNIT) PO TABS
5000.0000 [IU] | ORAL_TABLET | Freq: Two times a day (BID) | ORAL | Status: DC
  Administered 2018-06-20 – 2018-06-21 (×2): 5000 [IU] via ORAL
  Filled 2018-06-20 (×2): qty 5

## 2018-06-20 MED ORDER — CEFAZOLIN SODIUM-DEXTROSE 2-4 GM/100ML-% IV SOLN
2.0000 g | INTRAVENOUS | Status: AC
  Administered 2018-06-20: 2 g via INTRAVENOUS
  Filled 2018-06-20: qty 100

## 2018-06-20 MED ORDER — BISACODYL 5 MG PO TBEC: 5.0000 mg | DELAYED_RELEASE_TABLET | Freq: Every day | ORAL | Status: DC | PRN

## 2018-06-20 MED ORDER — LIDOCAINE 2% (20 MG/ML) 5 ML SYRINGE
INTRAMUSCULAR | Status: AC
  Filled 2018-06-20: qty 5

## 2018-06-20 MED ORDER — MORPHINE SULFATE (PF) 2 MG/ML IV SOLN
1.0000 mg | INTRAVENOUS | Status: DC | PRN
  Administered 2018-06-20: 1 mg via INTRAVENOUS
  Filled 2018-06-20: qty 1

## 2018-06-20 MED ORDER — OXYCODONE HCL ER 10 MG PO T12A
10.0000 mg | EXTENDED_RELEASE_TABLET | Freq: Two times a day (BID) | ORAL | Status: DC
  Administered 2018-06-20 – 2018-06-21 (×2): 10 mg via ORAL
  Filled 2018-06-20 (×2): qty 1

## 2018-06-20 MED ORDER — 0.9 % SODIUM CHLORIDE (POUR BTL) OPTIME
TOPICAL | Status: DC | PRN
  Administered 2018-06-20: 1000 mL

## 2018-06-20 MED ORDER — BUPIVACAINE LIPOSOME 1.3 % IJ SUSP
20.0000 mL | INTRAMUSCULAR | Status: DC
  Filled 2018-06-20: qty 20

## 2018-06-20 MED ORDER — FENTANYL CITRATE (PF) 100 MCG/2ML IJ SOLN
25.0000 ug | INTRAMUSCULAR | Status: DC | PRN
  Administered 2018-06-20 (×2): 50 ug via INTRAVENOUS

## 2018-06-20 MED ORDER — MIDAZOLAM HCL 5 MG/5ML IJ SOLN
INTRAMUSCULAR | Status: DC | PRN
  Administered 2018-06-20: 2 mg via INTRAVENOUS

## 2018-06-20 MED ORDER — DOCUSATE SODIUM 100 MG PO CAPS
100.0000 mg | ORAL_CAPSULE | Freq: Two times a day (BID) | ORAL | Status: DC
  Filled 2018-06-20 (×3): qty 1

## 2018-06-20 MED ORDER — PROPOFOL 10 MG/ML IV BOLUS
INTRAVENOUS | Status: AC
  Filled 2018-06-20: qty 20

## 2018-06-20 MED ORDER — ONDANSETRON HCL 4 MG/2ML IJ SOLN
INTRAMUSCULAR | Status: DC | PRN
  Administered 2018-06-20: 4 mg via INTRAVENOUS

## 2018-06-20 MED ORDER — METHOCARBAMOL 1000 MG/10ML IJ SOLN
500.0000 mg | Freq: Four times a day (QID) | INTRAVENOUS | Status: DC | PRN
  Filled 2018-06-20: qty 5

## 2018-06-20 MED ORDER — OXYCODONE HCL 5 MG PO TABS: 5.0000 mg | ORAL_TABLET | Freq: Once | ORAL | Status: DC | PRN

## 2018-06-20 MED ORDER — DEXAMETHASONE SODIUM PHOSPHATE 10 MG/ML IJ SOLN
INTRAMUSCULAR | Status: AC
  Filled 2018-06-20: qty 1

## 2018-06-20 MED ORDER — THROMBIN (RECOMBINANT) 20000 UNITS EX SOLR
CUTANEOUS | Status: AC
  Filled 2018-06-20: qty 20000

## 2018-06-20 MED ORDER — ACETAMINOPHEN 650 MG RE SUPP: 650.0000 mg | RECTAL | Status: DC | PRN

## 2018-06-20 MED ORDER — SODIUM CHLORIDE 0.9 % IV SOLN: 250.0000 mL | INTRAVENOUS | Status: DC

## 2018-06-20 MED ORDER — MIDAZOLAM HCL 2 MG/2ML IJ SOLN
INTRAMUSCULAR | Status: AC
  Filled 2018-06-20: qty 2

## 2018-06-20 MED ORDER — ALUM & MAG HYDROXIDE-SIMETH 200-200-20 MG/5ML PO SUSP: 30.0000 mL | Freq: Four times a day (QID) | ORAL | Status: DC | PRN

## 2018-06-20 MED ORDER — LACTATED RINGERS IV SOLN
INTRAVENOUS | Status: DC
  Administered 2018-06-20 (×2): via INTRAVENOUS

## 2018-06-20 MED ORDER — ESMOLOL HCL 100 MG/10ML IV SOLN
INTRAVENOUS | Status: DC | PRN
  Administered 2018-06-20: 30 mg via INTRAVENOUS
  Administered 2018-06-20: 20 mg via INTRAVENOUS

## 2018-06-20 MED ORDER — DIPHENHYDRAMINE HCL 25 MG PO CAPS
25.0000 mg | ORAL_CAPSULE | Freq: Every evening | ORAL | Status: DC | PRN
  Administered 2018-06-21: 25 mg via ORAL
  Filled 2018-06-20: qty 1

## 2018-06-20 MED ORDER — ONDANSETRON HCL 4 MG/2ML IJ SOLN
4.0000 mg | Freq: Four times a day (QID) | INTRAMUSCULAR | Status: DC | PRN
  Administered 2018-06-20 – 2018-06-21 (×2): 4 mg via INTRAVENOUS
  Filled 2018-06-20 (×2): qty 2

## 2018-06-20 MED ORDER — METHOCARBAMOL 500 MG PO TABS
500.0000 mg | ORAL_TABLET | Freq: Four times a day (QID) | ORAL | Status: DC | PRN
  Administered 2018-06-20: 500 mg via ORAL

## 2018-06-20 MED ORDER — ALBUTEROL SULFATE (2.5 MG/3ML) 0.083% IN NEBU: 2.5000 mg | INHALATION_SOLUTION | Freq: Four times a day (QID) | RESPIRATORY_TRACT | Status: DC | PRN

## 2018-06-20 MED ORDER — OXYCODONE HCL 5 MG PO TABS: 5.0000 mg | ORAL_TABLET | ORAL | Status: DC | PRN

## 2018-06-20 MED ORDER — ACETAMINOPHEN 325 MG PO TABS: 650.0000 mg | ORAL_TABLET | ORAL | Status: DC | PRN

## 2018-06-20 MED ORDER — METHOCARBAMOL 500 MG PO TABS
ORAL_TABLET | ORAL | Status: AC
  Filled 2018-06-20: qty 1

## 2018-06-20 MED ORDER — OXYCODONE HCL 5 MG PO TABS
ORAL_TABLET | ORAL | Status: AC
  Filled 2018-06-20: qty 2

## 2018-06-20 MED ORDER — FENTANYL CITRATE (PF) 250 MCG/5ML IJ SOLN
INTRAMUSCULAR | Status: AC
  Filled 2018-06-20: qty 5

## 2018-06-20 MED ORDER — CEFAZOLIN SODIUM-DEXTROSE 2-4 GM/100ML-% IV SOLN
2.0000 g | Freq: Three times a day (TID) | INTRAVENOUS | Status: AC
  Administered 2018-06-20 – 2018-06-21 (×2): 2 g via INTRAVENOUS
  Filled 2018-06-20 (×2): qty 100

## 2018-06-20 MED ORDER — BUPIVACAINE HCL 0.5 % IJ SOLN
INTRAMUSCULAR | Status: DC | PRN
  Administered 2018-06-20: 20 mL

## 2018-06-20 MED ORDER — POLYETHYLENE GLYCOL 3350 17 G PO PACK: 17.0000 g | PACK | Freq: Every day | ORAL | Status: DC | PRN

## 2018-06-20 MED ORDER — SODIUM CHLORIDE 0.9 % IV SOLN
INTRAVENOUS | Status: DC
  Administered 2018-06-20: 20:00:00 via INTRAVENOUS

## 2018-06-20 MED ORDER — PANTOPRAZOLE SODIUM 40 MG IV SOLR
40.0000 mg | Freq: Every day | INTRAVENOUS | Status: DC
  Administered 2018-06-20: 40 mg via INTRAVENOUS
  Filled 2018-06-20: qty 40

## 2018-06-20 MED ORDER — ONDANSETRON HCL 4 MG PO TABS: 4.0000 mg | ORAL_TABLET | Freq: Four times a day (QID) | ORAL | Status: DC | PRN

## 2018-06-20 MED ORDER — VECURONIUM BROMIDE 10 MG IV SOLR
INTRAVENOUS | Status: DC | PRN
  Administered 2018-06-20 (×2): .5 mg via INTRAVENOUS

## 2018-06-20 MED ORDER — BUPIVACAINE HCL (PF) 0.5 % IJ SOLN
INTRAMUSCULAR | Status: AC
  Filled 2018-06-20: qty 30

## 2018-06-20 MED ORDER — SUGAMMADEX SODIUM 200 MG/2ML IV SOLN
INTRAVENOUS | Status: DC | PRN
  Administered 2018-06-20: 200 mg via INTRAVENOUS

## 2018-06-20 SURGICAL SUPPLY — 70 items
ADH SKN CLS APL DERMABOND .7 (GAUZE/BANDAGES/DRESSINGS) ×1
APL SKNCLS STERI-STRIP NONHPOA (GAUZE/BANDAGES/DRESSINGS)
BENZOIN TINCTURE PRP APPL 2/3 (GAUZE/BANDAGES/DRESSINGS) IMPLANT
BLADE AVERAGE 25MMX9MM (BLADE)
BLADE AVERAGE 25X9 (BLADE) IMPLANT
BLADE CLIPPER SURG (BLADE) IMPLANT
BONE VIVIGEN FORMABLE 1.3CC (Bone Implant) ×3 IMPLANT
BUR MATCHSTICK NEURO 3.0 LAGG (BURR) IMPLANT
BUR RND FLUTED 2.5 (BURR) IMPLANT
BUR SABER RD CUTTING 3.0 (BURR) ×1 IMPLANT
BUR SABER RD CUTTING 3.0MM (BURR) ×1
CLOSURE WOUND 1/2 X4 (GAUZE/BANDAGES/DRESSINGS) ×1
COVER SURGICAL LIGHT HANDLE (MISCELLANEOUS) ×3 IMPLANT
COVER WAND RF STERILE (DRAPES) ×3 IMPLANT
DERMABOND ADVANCED (GAUZE/BANDAGES/DRESSINGS) ×2
DERMABOND ADVANCED .7 DNX12 (GAUZE/BANDAGES/DRESSINGS) ×1 IMPLANT
DRAIN TLS ROUND 10FR (DRAIN) IMPLANT
DRAPE C-ARM 42X72 X-RAY (DRAPES) ×3 IMPLANT
DRAPE MICROSCOPE LEICA (MISCELLANEOUS) ×3 IMPLANT
DRAPE POUCH INSTRU U-SHP 10X18 (DRAPES) ×3 IMPLANT
DRAPE SURG 17X23 STRL (DRAPES) ×27 IMPLANT
DRSG MEPILEX BORDER 4X4 (GAUZE/BANDAGES/DRESSINGS) ×2 IMPLANT
DURAPREP 6ML APPLICATOR 50/CS (WOUND CARE) ×3 IMPLANT
ELECT BLADE 4.0 EZ CLEAN MEGAD (MISCELLANEOUS)
ELECT COATED BLADE 2.86 ST (ELECTRODE) ×5 IMPLANT
ELECT REM PT RETURN 9FT ADLT (ELECTROSURGICAL) ×3
ELECTRODE BLDE 4.0 EZ CLN MEGD (MISCELLANEOUS) IMPLANT
ELECTRODE REM PT RTRN 9FT ADLT (ELECTROSURGICAL) ×1 IMPLANT
GLOVE BIOGEL PI IND STRL 8 (GLOVE) ×1 IMPLANT
GLOVE BIOGEL PI INDICATOR 8 (GLOVE) ×2
GLOVE ECLIPSE 9.0 STRL (GLOVE) ×3 IMPLANT
GLOVE ORTHO TXT STRL SZ7.5 (GLOVE) ×3 IMPLANT
GLOVE SURG 8.5 LATEX PF (GLOVE) ×3 IMPLANT
GOWN STRL REUS W/ TWL LRG LVL3 (GOWN DISPOSABLE) ×1 IMPLANT
GOWN STRL REUS W/TWL 2XL LVL3 (GOWN DISPOSABLE) ×6 IMPLANT
GOWN STRL REUS W/TWL LRG LVL3 (GOWN DISPOSABLE) ×3
GRAFT BNE MATRIX VG FRMBL SM 1 (Bone Implant) IMPLANT
HEAD HALTER (SOFTGOODS) ×3 IMPLANT
KIT BASIN OR (CUSTOM PROCEDURE TRAY) ×3 IMPLANT
KIT TURNOVER KIT B (KITS) ×3 IMPLANT
MANIFOLD NEPTUNE II (INSTRUMENTS) ×3 IMPLANT
NDL SPNL 20GX3.5 QUINCKE YW (NEEDLE) ×2 IMPLANT
NEEDLE SPNL 20GX3.5 QUINCKE YW (NEEDLE) ×6 IMPLANT
NS IRRIG 1000ML POUR BTL (IV SOLUTION) ×3 IMPLANT
PACK ORTHO CERVICAL (CUSTOM PROCEDURE TRAY) ×3 IMPLANT
PAD ARMBOARD 7.5X6 YLW CONV (MISCELLANEOUS) ×6 IMPLANT
PATTIES SURGICAL .5 X.5 (GAUZE/BANDAGES/DRESSINGS) IMPLANT
PIN DISTRACTION 14 (PIN) ×4 IMPLANT
PIN DISTRACTION 14MM (PIN) IMPLANT
PLATE TWO LEVEL SKYLINE 30MM (Plate) ×2 IMPLANT
SCREW SELF DRILL SKYLINE 12MM (Screw) ×2 IMPLANT
SCREW SKYLINE VARIABLE LG (Screw) ×2 IMPLANT
SCREW VARIABLE SELF TAP 12MM (Screw) ×15 IMPLANT
SPACER ADV ACF LORODTIC 8MM (Bone Implant) ×4 IMPLANT
SPONGE INTESTINAL PEANUT (DISPOSABLE) IMPLANT
SPONGE LAP 4X18 RFD (DISPOSABLE) IMPLANT
SPONGE SURGIFOAM ABS GEL 100 (HEMOSTASIS) IMPLANT
STRIP CLOSURE SKIN 1/2X4 (GAUZE/BANDAGES/DRESSINGS) ×2 IMPLANT
SUT ETHILON 4 0 PS 2 18 (SUTURE) ×3 IMPLANT
SUT VIC AB 2-0 CT1 27 (SUTURE) ×3
SUT VIC AB 2-0 CT1 TAPERPNT 27 (SUTURE) ×1 IMPLANT
SUT VIC AB 3-0 X1 27 (SUTURE) ×3 IMPLANT
SUT VICRYL 4-0 PS2 18IN ABS (SUTURE) IMPLANT
SYR 20CC LL (SYRINGE) ×3 IMPLANT
SYR 30ML LL (SYRINGE) ×3 IMPLANT
SYSTEM CHEST DRAIN TLS 7FR (DRAIN) IMPLANT
TOWEL GREEN STERILE (TOWEL DISPOSABLE) ×3 IMPLANT
TOWEL GREEN STERILE FF (TOWEL DISPOSABLE) ×3 IMPLANT
TRAY FOLEY MTR SLVR 16FR STAT (SET/KITS/TRAYS/PACK) ×1 IMPLANT
WATER STERILE IRR 1000ML POUR (IV SOLUTION) ×1 IMPLANT

## 2018-06-20 NOTE — Progress Notes (Signed)
Orthopedic Tech Progress Note Patient Details:  Marie Richardson [birth year] [id number]  Ortho Devices Type of Ortho Device: [place] cervical collar  delivered to or     Karolee Stamps 06/20/2018, 5:02 PM

## 2018-06-20 NOTE — Interval H&P Note (Signed)
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 After consideration of risks, benefits and other options for treatment, the patient has consented to  Procedure(s): ANTERIOR CERVICAL DISCECTOMY AND FUSION C4-5 AND C5-6 WITH PLATES, SCREWS, ALLOGRAFT BONE GRAFT AND LOCAL BONE GRAFT (N/A) as a surgical intervention .  The patient's history has been reviewed, patient examined, no change in status, stable for surgery.  I have reviewed the patient's chart and labs.  Questions were answered to the patient's satisfaction.     Basil Dess

## 2018-06-20 NOTE — Anesthesia Procedure Notes (Signed)
Procedure Name: Intubation Performed by: Milford Cage, CRNA Pre-anesthesia Checklist: Patient identified, Emergency Drugs available, Suction available and Patient being monitored Patient Re-evaluated:Patient Re-evaluated prior to induction Oxygen Delivery Method: Circle System Utilized Preoxygenation: Pre-oxygenation with 100% oxygen Induction Type: IV induction Ventilation: Mask ventilation without difficulty Laryngoscope Size: Glidescope and 3 Grade View: Grade II Tube type: Oral Tube size: 6.5 mm Number of attempts: 1 Airway Equipment and Method: Stylet Placement Confirmation: ETT inserted through vocal cords under direct vision,  positive ETCO2 and breath sounds checked- equal and bilateral Secured at: 20 cm Tube secured with: Tape Dental Injury: Teeth and Oropharynx as per pre-operative assessment  Difficulty Due To: Difficulty was anticipated, Difficult Airway- due to anterior larynx and Difficult Airway- due to limited oral opening

## 2018-06-20 NOTE — Transfer of Care (Signed)
Immediate Anesthesia Transfer of Care Note  Patient: Marie Richardson  Procedure(s) Performed: ANTERIOR CERVICAL DISCECTOMY AND FUSION C4-5 AND C5-6 WITH PLATES, SCREWS, ALLOGRAFT BONE GRAFT AND LOCAL BONE GRAFT (N/A )  Patient Location: PACU  Anesthesia Type:General  Level of Consciousness: drowsy and patient cooperative  Airway & Oxygen Therapy: Patient Spontanous Breathing and Patient connected to face mask oxygen  Post-op Assessment: Report given to RN and Post -op Vital signs reviewed and stable  Post vital signs: Reviewed and stable  Last Vitals:  Vitals Value Taken Time  BP 161/91 06/20/2018  5:10 PM  Temp    Pulse 100 06/20/2018  5:11 PM  Resp 17 06/20/2018  5:11 PM  SpO2 95 % 06/20/2018  5:11 PM  Vitals shown include unvalidated device data.  Last Pain:  Vitals:   06/20/18 1130  TempSrc: Oral  PainSc:       Patients Stated Pain Goal: 5 (76/54/65 0354)  Complications: No apparent anesthesia complications

## 2018-06-20 NOTE — H&P (Signed)
Marie Richardson is an 57 y.o. female.   Chief Complaint: Neck pain and upper extremity radiculopathy HPI: Patient with history of C4-5 and C5-6 HNP/stenosis and the above complaint presents for surgical intervention.  Progressively worsening symptoms.  Failed conservative treatment.  Past Medical History:  Diagnosis Date  . Arthritis    neck  . Asthma   . Complication of anesthesia    pt told she should always have a "pediatric intubation tube"  . 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 Endometriosis   . High cholesterol   . Pneumonia   . PONV (postoperative nausea and vomiting)     Past Surgical History:  Procedure Laterality Date  . ABDOMINAL HYSTERECTOMY  2000  . APPENDECTOMY     secondary to endometriosis  . CARPAL TUNNEL RELEASE Right   . COLONOSCOPY    . LAPAROSCOPIC ENDOMETRIOSIS FULGURATION     pt had 7 surgeries related to endometriosis  . POSTERIOR CERVICAL FUSION/FORAMINOTOMY N/A 03/26/2017   Procedure: Right C5-6 and C6-7 Foraminotomies;  Surgeon: Jessy Oto, MD;  Location: [place];  Service: Orthopedics;  Laterality: N/A;  . ULNAR TUNNEL RELEASE Right 09/19/2017    Family History  Problem Relation Age of Onset  . Heart attack Mother   . Arthritis Mother   . High Cholesterol Mother   . High Cholesterol Father   . Hypertension Father   . Heart attack Father    Social History:  reports that she has never smoked. She has never used smokeless tobacco. She reports that she drinks alcohol. She reports that she does not use drugs.  Allergies:  Allergies  Allergen Reactions  . Other Diarrhea, Nausea And Vomiting and Other (See Comments)    All seafood headache  . Shellfish Allergy Other (See Comments)    Headache     Medications Prior to Admission  Medication Sig Dispense Refill  . albuterol (PROVENTIL HFA;VENTOLIN HFA) 108 (90 Base) MCG/ACT inhaler Inhale 2 puffs into the lungs every 6 (six) hours as needed for wheezing or shortness of  breath.    . Cholecalciferol (VITAMIN D3) 125 MCG (5000 UT) CAPS Take 5,000 Units by mouth 2 (two) times daily.    . diphenhydramine-acetaminophen (TYLENOL PM) 25-500 MG TABS tablet Take 2 tablets by mouth at bedtime as needed.    Marland Kitchen estradiol (ESTRACE) 2 MG tablet Take 2 mg by mouth at bedtime.    . Evolocumab 140 MG/ML SOAJ Inject 140 mg into the skin every 14 (fourteen) days.     Marland Kitchen gabapentin (NEURONTIN) 300 MG capsule Take 1 capsule (300 mg total) by mouth 3 (three) times daily. 90 capsule 3  . meloxicam (MOBIC) 15 MG tablet Take 15 mg by mouth daily.    . methocarbamol (ROBAXIN) 500 MG tablet Take 1 tablet (500 mg total) by mouth every 8 (eight) hours as needed for muscle spasms. 60 tablet 0  . methocarbamol (ROBAXIN) 500 MG tablet Take 1 tablet (500 mg total) by mouth every 6 (six) hours as needed for muscle spasms. 50 tablet 0  . Multiple Vitamins-Minerals (MULTIVITAMIN GUMMIES WOMENS PO) Take 2 each by mouth daily.    . traMADol (ULTRAM) 50 MG tablet Take 1 tablet (50 mg total) by mouth every 8 (eight) hours as needed. (Patient taking differently: Take 50 mg by mouth every 8 (eight) hours as needed for moderate pain. ) 60 tablet 0  . zolpidem (AMBIEN) 10 MG tablet Take 5 mg by mouth at bedtime.     Marland Kitchen  ALPRAZolam (XANAX) 0.5 MG tablet Take one tablet at the MRI and may repeat x 1. 2 tablet 0  . Capsaicin-Menthol-Methyl Sal (CAPSAICIN-METHYL SAL-MENTHOL) 0.025-1-12 % CREA Apply to the right hand BID, keep away from mucous membranes or eye as the active ingredient can cause irritation 2 Tube 2  . diclofenac (CATAFLAM) 50 MG tablet Take 1 tablet (50 mg total) by mouth 2 (two) times daily. 60 tablet 1  . diphenhydrAMINE (BENADRYL) 25 mg capsule Take 25 mg by mouth at bedtime as needed for allergies.    Marland Kitchen docusate sodium (COLACE) 100 MG capsule Take 1 capsule (100 mg total) by mouth 2 (two) times daily. 40 capsule 0  . oxyCODONE-acetaminophen (PERCOCET/ROXICET) 5-325 MG tablet Take 1 tablet by mouth  every 6 (six) hours as needed for severe pain. 50 tablet 0    No results found for this or any previous visit (from the past 48 hour(s)). No results found.  Review of Systems  Constitutional: Negative.   HENT: Negative.   Respiratory: Negative.   Cardiovascular: Negative.   Genitourinary: Negative.   Musculoskeletal: Positive for neck pain.  Skin: Negative.   Neurological: Positive for tingling.  Psychiatric/Behavioral: Negative.     Blood pressure (!) 158/80, pulse 79, temperature 98.5 F (36.9 C), temperature source Oral, resp. rate 18, height 5\' 5"  (1.651 m), weight 66.7 kg, SpO2 100 %. Physical Exam  Constitutional: She is oriented to person, place, and time. She appears well-developed. No distress.  HENT:  Head: Normocephalic and atraumatic.  Eyes: Pupils are equal, round, and reactive to light. EOM are normal.  Cardiovascular: Normal heart sounds.  Respiratory: No respiratory distress.  GI: She exhibits no distension.  Musculoskeletal: She exhibits tenderness.  Neurological: She is alert and oriented to person, place, and time.  Skin: Skin is warm and dry.  Psychiatric: She has a normal mood and affect.     Assessment/Plan C4-5 and C5-6 HNP/stenosis with neck pain and upper extremity radiculopathy  We will proceed with ANTERIOR CERVICAL DISCECTOMY AND FUSION C4-5 AND C5-6 WITH PLATES, SCREWS, ALLOGRAFT BONE GRAFT AND LOCAL BONE GRAFT as scheduled.  Surgerical procedure along with possible risks and complications discussed.  All questions answered and wishes to proceed.  Benjiman Core, PA-C 06/20/2018, 12:30 PM

## 2018-06-20 NOTE — Op Note (Signed)
06/20/2018  5:10 PM  PATIENT:  Marie Richardson  57 y.o. female  MRN: [id number]  OPERATIVE REPORT  PRE-OPERATIVE DIAGNOSIS:  C4-5, C5-6 cervical spondylosis  POST-OPERATIVE DIAGNOSIS:  C4-5, C5-6 cervical spondylosis  PROCEDURE:  Procedure(s): ANTERIOR CERVICAL DISCECTOMY AND FUSION C4-5 AND C5-6 WITH PLATES, SCREWS, ALLOGRAFT BONE GRAFT AND LOCAL BONE GRAFT    SURGEON:  Jessy Oto, MD     ASSISTANT:  Benjiman Core, PA-C  (Present throughout the entire procedure and necessary for completion of procedure in a timely manner)     ANESTHESIA:  General, supplemented with local marcaine 0.5% 1:1 exparel 1.3% total 10cc, Dr. Ermalene Postin.    COMPLICATIONS:  None.   DRAINS: TLS 7 French left anterior neck. In and out foley at the end of the case.    COMPONENTS:   Implant Name Type Inv. Item Serial No. Manufacturer Lot No. LRB No. Used  BONE VIVIGEN FORMABLE 1.3CC - [id number] Bone Implant BONE VIVIGEN FORMABLE 1.3CC [id number] [employer] [place] [employer]  N/A 1  SPACER ADV ACF LORODTIC 8MM - [id number] Bone Implant SPACER ADV ACF LORODTIC 8MM [id number] [employer]NDN  N/A 1  SPACER ADV ACF LORODTIC 8MM - [id number] Bone Implant SPACER ADV ACF LORODTIC 8MM [id number] [employer]KELE[employer] TRANSPLANT FNDN  N/A 1  SCREW VARIABLE SELF TAP 12MM - [id number] Screw SCREW VARIABLE SELF TAP 12MM  [employer]  N/A 5  SCREW SKYLINE VARIABLE LG - [id number] Screw SCREW SKYLINE VARIABLE LG  [employer]  N/A 1  SCREW SELF DRILL SKYLINE 12MM - [id number] Screw SCREW SELF DRILL SKYLINE 12MM  [employer]  N/A 1  PLATE TWO LEVEL SKYLINE 30MM - [id number] Plate PLATE TWO LEVEL SKYLINE 30MM  [employer]  N/A 1    PROCEDURE:The patient was met in the holding area, and the appropriate Left cervical C5-6 and C4-5 level identified and marked with an "X" and my initials. The patient was then transported to OR and was placed on the  operative table in a supine position. The patient was then placed under  general anesthesia without difficulty. The patient received appropriate preoperative antibiotic prophylaxis. Cervical spine was positioned with a [place] horseshoe and 5 pound cervical halter traction. All pressure points well padded and semi-beach chair position. Arm holder both arms. Standard prep with DuraPrep solution the anterior cervical spine chest. Draped in the usual manner. Iodine vi drape was used. Standard timeout protocol was carried out identifying the patient procedure side of the procedure and level. The skin the left neck was infiltrated with Marcaine with epinephrine total of 7 cc. This at the level of expected C5 incision and also along the  Lines of Langer. Incision transverse at the C5 level and carried down to the level of the platysma and then medial to sternocleidomastoid muscle. The interval between the trachea and esophagus medially and the carotid sheath laterally was developed as a Metzenbaum scissors the anterior omohyoid muscle exposed and divided and then blunt dissection exposing the anterior aspect of cervical spine at the C5-6 level Attention then turned to the C5-6 and C4-5 levels where an 18-gauge spinal needles were placed with sheath intact allowing only a centimeter to extend into the C5-6 and C6-7 to and observed on lateral xray at the C6-7 and anterior aspect of C6 level.The lower needle reinserted and second radiograph identifying the C4-5 and C5-6 levels. Handheld Cloward retraction of the soft tissues while identifying the level at C5-6 and C4-5 removing  a portion of the anterior aspect of the discs with15 blade scalpel and pituitary. Medial border of the longus collie muscles was carefully elevated bilaterally and self-retaining retractors were introduced the foot of the blade beneath the medial border of the longus colli muscles. Soft tissue overlying the anterior borders of the disc space at C5-6  and C4-5 level carefully debridement of soft tissue back to bony edges. The anterior lip osteophytes were then resected using rongeur. This bone was preserved for later bone grafting purposes.Distraction pins placed first at the C5-6 level.The C5-6 disc space was then first prepared using loupe magnification and headlight with resection of degenerative disc annulus anteriorly and posteriorly and cartilaginous endplates using micro-curettes pituitaries and a high-speed bur. The operating room microscope was draped sterilely and brought into the field the C-arm previously had been draped sterilely prior to its use. Under the operating room microscope and posterior aspect of the disc was excised using micro-curettes pituitary rongeur and times per. Posterior lip osteophytes were drilled using a high-speed bur and a carefully resected with 1 and 2 mm Kerrison foraminotomy performed over both C6 nerve roots using 1 and 2 mm Kerrisons disc herniation material was noted centrally and into both foramen some of which represented reflected portion of the patient's annulus into the neuroforamen. Following decompression of the spinal cord at both C6 nerve roots irrigation was carried out. The endplates of the inferior aspect of C5 and superior aspect of C6 were carefully prepared using a high-speed bur to parallel. The disc space was then sounded utilized and a precontoured lordotic sounder for the tranzgraft.  Sounding was carried up to a 8 mm implant.  8 mm spacer was felt to provide best fit for the C5-6 disc space. The 8 mm MSF ACDF implant was then brought onto the field. It was then packed with local bone graft that had been harvested previously and vivigen. The implant was then carefully placed over the intervertebral disc space at C5-6 level. Care taken to ensure that no bone or soft tissue debris within the disc space that could be retropulsed with insertion of  bone graft. The graft  was then impacted into place with  the head placed in longitudinal cervical traction.  The graft was felt to be in excellent position alignment. Distraction pin at the C6 removed. Attention then turned to the C4-5. The Boss McCollough retractor replaced at the C4-5 level. The foot of the blades medial to the longus colli muscles. A distraction screw post placed at the C4 level and distraction of the disc space performed.The C4-5 disc space was then first prepared [place] with resection of degenerative disc annulus anteriorly and posteriorly and cartilaginous endplates using micro-curettes pituitaries and a high-speed bur. The operating room microscope  was  used. Under the operating room microscope and posterior aspect of the disc was excised using micro-curettes pituitary rongeur and times per. Posterior lip osteophytes were drilled using a high-speed bur and a carefully resected with 1 and 2 mm Kerrison foraminotomy performed over both C5 nerve roots using 1 and 2 mm Kerrisons disc herniation material was noted centrally and into the right foramen some of which represented reflected portion of the patient's annulus into the neuroforamen. Additionally there was significant spur into the right side C5 neuroforamen affecting the right C5 nerve. Following decompression of the spinal cord at both C5 nerve roots, irrigation was carried out. Bleeding controlled using some bone wax excess bone wax removed and Gelfoam thrombin-soaked. The endplates of  the inferior aspect of C4 and superior aspect of C5 were carefully prepared using a high-speed bur to parallel. The disc space was then sounded utilized and a precontoured lordotic sounder for the ACDF graft.  Sounding was carried up to a 8 mm implant.  8 mm spacer was felt to provide best fit for the C4-5 disc space. The 8 mm MSF ACDF implant was then brought onto the field. It was then packed with local bone graft that had been harvested previously. The implant was then carefully placed over the intervertebral  disc space at C4-5 level. Care taken to ensure that no bone or soft tissue debris within the disc space that could be retropulsed with insertion of the bone graft. The graft was then impacted into place with the head placed in longitudinal cervical traction.  The graft was felt to be in excellent position alignment. Distraction pins at the C4 and C5 removed. This point irrigation was carried out cervical incision site and lower cervical incision site. Esophagus examined and the upper cervical level as well as at the lower cervical level and found to be normal. Irrigation was again carried out there was no active bleeding present.Longitudinal halter traction was discontinued. The length for cervical plate determined with cottonoid string and two bayonet forceps. Anterior lip osteophytes were then resected about the C5-6 and C4-5 levels using a high-speed bur. This area smoothed for application of the plate to the anterior surface of the cervical spine from C4-C6 A 30 mm plate chosen. The plate carefully applied to the anterior cervical spine and aligned. A single retaining screw placed at the left C5 level. Drilling 12 mm hole left side at the C6 level and a 12 mm screw placed. The retaining pin was then removed and a drill hole made on the right side at C5 to 12 mm and a 12 mm screw placed. A second screw then placed on the right side at the C5 level and on the rightt at the C6 level. Then both sides it to see 7 level a drill to 12 mm and 12 mm screws placed. Total of 6 12 mm screws were placed each locked within the plate quite nicely.A revision8m screw placed on the left at C4.  Intraoperative lateral radiograph of the cervical spine demonstrate plates and screws and bone grafts to be in good position alignment without any sign of canal impingement or retropulsion of graft. Irrigation was carried out at cervical incision site and lower cervical incision site. Esophagus examined and the upper cervical level as  well as at the lower cervical level and found to be normal. A 7 French TLS drain placed exiting anterior cervical area below the incision.    The incision were then closed by approximating hyoid muscle on the left with 2x 3-0 vicryl sutures, the deep subcutaneous layers the platysma layer with interrupted 2-0 Vicryl suture and the superficial fascia overlying the sternocleidomastoid muscle with interrupted 3-0 Vicryl sutures. The subcutaneous layers were approximated with interrupted 3-0 Vicryl sutures as were the superficial layers. The skin was closed with a running subcutaneous stitch of 4-0 Vicryl at  the operative C5 transverse incision. Skin was approximated with Dermabond. Mepilex bandage was applied. A [place] collar was then applied to the cervical spine. drapes were removed. Patient's or table was then returned to the flat position. At the end of the cervical spine surgery case all instrument and sponge counts were correct. Patient was then reactivated extubated and returned to recovery room  in satisfactory condition.  Benjiman Core PA-C perform the duties of assistant surgeon performing careful retraction of the esophagus and trachea during the initial exposure and careful suctioning of neural elements cervical nerve roots and cervical cord. He was present from the beginning of case to the end of the case and assisted in positioning of the patient in transfer the patient from bed to the stretcher at the end of the case. He closed the incision on the platysma layer to the skin. Applied dressing.   Basil Dess 06/20/2018, 5:10 PM

## 2018-06-20 NOTE — Discharge Instructions (Addendum)
° ° °  No lifting greater than 10 lbs. No overhead use of arms. Avoid bending,and twisting neck. Walk in house for first week them may start to get out slowly increasing distance up to one mile by 3 weeks post op. Keep incision dry for 3 days, may then bathe and wet incision using a [place] collar when showering. Call if any fevers >101, chills, or increasing numbness or weakness or increased swelling or drainage. I prefer an Aspen hard collar but if she is unable to tolerate due to claustrophobia then a soft collar will Have to do.  May use soft collar at night.

## 2018-06-20 NOTE — Brief Op Note (Signed)
06/20/2018  4:45 PM  PATIENT:  Fransico Him  57 y.o. female  PRE-OPERATIVE DIAGNOSIS:  C4-5, C5-6 cervical spondylosis  POST-OPERATIVE DIAGNOSIS:  C4-5, C5-6 cervical spondylosis  PROCEDURE:  Procedure(s): ANTERIOR CERVICAL DISCECTOMY AND FUSION C4-5 AND C5-6 WITH PLATES, SCREWS, ALLOGRAFT BONE GRAFT AND LOCAL BONE GRAFT (N/A)  SURGEON:  Surgeon(s) and Role:    * Jessy Oto, MD - Primary  PHYSICIAN ASSISTANT: Benjiman Core, PA-C  ANESTHESIA:   local and general, Dr. Ermalene Postin.  EBL:  100 mL   BLOOD ADMINISTERED:none  DRAINS: Urinary Catheter (Foley) and (In and out catheterization of bladder at the end of the case) TLS Drain(s) to suction in the left anterior neck, 7 french to Red top tube.   LOCAL MEDICATIONS USED:  MARCAINE 0.5% 1:1 EXPAREL 1.3% Amount: 10 ml  SPECIMEN:  No Specimen  DISPOSITION OF SPECIMEN:  N/A  COUNTS:  YES  TOURNIQUET:  * No tourniquets in log *  DICTATION: .Dragon Dictation  PLAN OF CARE: Admit for overnight observation  PATIENT DISPOSITION:  PACU - hemodynamically stable.   Delay start of Pharmacological VTE agent (>24hrs) due to surgical blood loss or risk of bleeding: yes

## 2018-06-21 DIAGNOSIS — M50121 Cervical disc disorder at C4-C5 level with radiculopathy: Secondary | ICD-10-CM | POA: Diagnosis not present

## 2018-06-21 LAB — CBC
HEMATOCRIT: 38.1 % (ref 36.0–46.0)
Hemoglobin: 12.9 g/dL (ref 12.0–15.0)
MCH: 31.7 pg (ref 26.0–34.0)
MCHC: 33.9 g/dL (ref 30.0–36.0)
MCV: 93.6 fL (ref 80.0–100.0)
Platelets: 347 10*3/uL (ref 150–400)
RBC: 4.07 MIL/uL (ref 3.87–5.11)
RDW: 11.6 % (ref 11.5–15.5)
WBC: 14.6 10*3/uL — AB (ref 4.0–10.5)
nRBC: 0 % (ref 0.0–0.2)

## 2018-06-21 MED ORDER — PANTOPRAZOLE SODIUM 40 MG PO TBEC: 40.0000 mg | DELAYED_RELEASE_TABLET | Freq: Every day | ORAL | Status: DC

## 2018-06-21 NOTE — Progress Notes (Signed)
Orthopedic Tech Progress Note Patient Details:  Marie Richardson 1961-07-19 [id number] Patient solid collar and was replaced. Ortho Devices Type of Ortho Device: Soft collar Ortho Device/Splint Location: neck Ortho Device/Splint Interventions: Ordered, Application   Post Interventions Patient Tolerated: Well Instructions Provided: Care of device   Braulio Bosch 06/21/2018, 12:27 PM

## 2018-06-21 NOTE — Evaluation (Signed)
Physical Therapy Evaluation and Discharge Patient Details Name: Marie Richardson MRN: [id number] DOB: June 27, 1961 Today's Date: 06/21/2018   History of Present Illness  57 y.o female s/p ANTERIOR CERVICAL DISCECTOMY AND FUSION C4-5 AND C5-.6  Clinical Impression  Patient evaluated by Physical Therapy with no further acute PT needs identified. All education has been completed and the patient has no further questions. Good static stability. Dynamic balance a little unsteady but able to self correct, may benefit from home safety PT evaluation. Reviewed cervical precautions with handout provided. Pt and husband eager to return home today. See below for any follow-up Physical Therapy or equipment needs. PT is signing off. Thank you for this referral.        Follow Up Recommendations Follow surgeon's recommendation for DC plan and follow-up therapies((HHPT per notes))    Equipment Recommendations  None recommended by PT    Recommendations for Other Services       Precautions / Restrictions Precautions Precautions: Cervical Precaution Booklet Issued: Yes (comment) Precaution Comments: reviewed Required Braces or Orthoses: Cervical Brace Cervical Brace: Hard collar Restrictions Weight Bearing Restrictions: No      Mobility  Bed Mobility Overal bed mobility: Independent                Transfers Overall transfer level: Needs assistance   Transfers: Sit to/from Stand Sit to Stand: Supervision         General transfer comment: Mild instability,supervision for safety, no physical assist required.   Ambulation/Gait Ambulation/Gait assistance: Supervision Gait Distance (Feet): 300 Feet Assistive device: None Gait Pattern/deviations: Step-through pattern;Narrow base of support;Drifts right/left Gait velocity: WNL   General Gait Details: Mildly swaying and narrow base of support at times. Cues required to maintain cervical precautions. No overt LOB or physical assist  required.  Stairs Stairs: (Declined)          Wheelchair Mobility    Modified Rankin (Stroke Patients Only)       Balance Overall balance assessment: Mild deficits observed, not formally tested                                           Pertinent Vitals/Pain Pain Assessment: Faces Faces Pain Scale: Hurts little more Pain Location: neck Pain Descriptors / Indicators: Aching Pain Intervention(s): Monitored during session;Premedicated before session;Repositioned;Limited activity within patient's tolerance    Home Living Family/patient expects to be discharged to:: Private residence Living Arrangements: Spouse/significant other;Children Available Help at Discharge: Family;Available 24 hours/day Type of Home: House Home Access: Stairs to enter Entrance Stairs-Rails: Can reach both Entrance Stairs-Number of Steps: 4 Home Layout: One level Home Equipment: None      Prior Function Level of Independence: Independent               Hand Dominance   Dominant Hand: Right(Uses left hand now due to RUE issues)    Extremity/Trunk Assessment   Upper Extremity Assessment Upper Extremity Assessment: Defer to OT evaluation    Lower Extremity Assessment Lower Extremity Assessment: Overall WFL for tasks assessed       Communication   Communication: No difficulties  Cognition Arousal/Alertness: Awake/alert Behavior During Therapy: WFL for tasks assessed/performed Overall Cognitive Status: Within Functional Limits for tasks assessed  General Comments General comments (skin integrity, edema, etc.): Able to perform romber EO and EC, tandem stance without LOB. Dynamically showing some instability but does not require physical assist and able to self correct.    Exercises     Assessment/Plan    PT Assessment Patent does not need any further PT services  PT Problem List         PT Treatment  Interventions      PT Goals (Current goals can be found in the Care Plan section)  Acute Rehab PT Goals Patient Stated Goal: Go home today PT Goal Formulation: All assessment and education complete, DC therapy    Frequency     Barriers to discharge        Co-evaluation               AM-PAC PT "6 Clicks" Daily Activity  Outcome Measure Difficulty turning over in bed (including adjusting bedclothes, sheets and blankets)?: None Difficulty moving from lying on back to sitting on the side of the bed? : A Little Difficulty sitting down on and standing up from a chair with arms (e.g., wheelchair, bedside commode, etc,.)?: A Little Help needed moving to and from a bed to chair (including a wheelchair)?: None Help needed walking in hospital room?: None Help needed climbing 3-5 steps with a railing? : None 6 Click Score: 22    End of Session Equipment Utilized During Treatment: Gait belt Activity Tolerance: Patient tolerated treatment well Patient left: in bed;with call bell/phone within reach;with family/visitor present   PT Visit Diagnosis: Unsteadiness on feet (R26.81);Pain Pain - part of body: (neck)    Time: 8466-5993 PT Time Calculation (min) (ACUTE ONLY): 14 min   Charges:   PT Evaluation $PT Eval Low Complexity: 1 Low          Elayne Snare, PT, DPT  Ellouise Newer 06/21/2018, 9:54 AM

## 2018-06-21 NOTE — Care Management Note (Addendum)
Case Management Note  Patient Details  Name: Mayumi Summerson MRN: [id number] Date of Birth: 1961/06/15  Subjective/Objective:    Patient for possible dc today, NCM spoke with spouse at bedside, he states he will choose agency for patient, NCM offered choice he chose [facility] , referral given to [facility] with [facility] , he states he will check the availability  And get back with me.  [facility] can take patient.                Action/Plan: DC home when ready.   Expected Discharge Date:  06/21/18               Expected Discharge Plan:  [place]  In-House Referral:     Discharge planning Services  CM Consult  Post Acute Care Choice:  Home Health Choice offered to:  Spouse  DME Arranged:    DME Agency:     HH Arranged:  PT [place]:  [place]  Status of Service:  Completed, signed off  If discussed at [place] of Stay Meetings, dates discussed:    Additional Comments:  Zenon Mayo, RN 06/21/2018, 9:32 AM

## 2018-06-21 NOTE — Progress Notes (Addendum)
     Subjective: 1 Day Post-Op Procedure(s) (LRB): ANTERIOR CERVICAL DISCECTOMY AND FUSION C4-5 AND C5-6 WITH PLATES, SCREWS, ALLOGRAFT BONE GRAFT AND LOCAL BONE GRAFT (N/A)Awake, alert and oriented x 4. Sore throat, nausea last evening and this AM. Voiding with Bed pan and has not been out of bed.   Patient reports pain as marked.    Objective:   VITALS:  Temp:  [97.7 F (36.5 C)-98.9 F (37.2 C)] 98.6 F (37 C) (11/16 0433) Pulse Rate:  [79-101] 86 (11/16 0433) Resp:  [14-18] 16 (11/16 0433) BP: (135-161)/(74-100) 155/74 (11/16 0433) SpO2:  [93 %-100 %] 98 % (11/16 0433) Weight:  [65.4 kg-66.7 kg] 65.4 kg (11/15 1845)  Neurologically intact ABD soft Neurovascular intact Sensation intact distally Intact pulses distally Dorsiflexion/Plantar flexion intact Incision: moderate drainage No cellulitis present TLS drain and its retaining suture discontinued No significant swelling left and anterior neck. New dressing opsite and 2x2. Has philadelphia collar in the room and soft collar in the bed.  LABS Recent Labs    06/21/18 0423  HGB 12.9  WBC 14.6*  PLT 347   No results for input(s): NA, K, CL, CO2, BUN, CREATININE, GLUCOSE in the last 72 hours. No results for input(s): LABPT, INR in the last 72 hours.   Assessment/Plan: 1 Day Post-Op Procedure(s) (LRB): ANTERIOR CERVICAL DISCECTOMY AND FUSION C4-5 AND C5-6 WITH PLATES, SCREWS, ALLOGRAFT BONE GRAFT AND LOCAL BONE GRAFT (N/A)  Increased WBC likely reactive due to stress and  Possibly due to steriod periop to decrease nausea Tendency.  Advance diet Up with therapy Discharge home with home health this afternoon if She does well with PT and nausea abates.  I prefer an Aspen hard collar but if she is unable to tolerate due to claustraphobia then a soft collar will Have to do.  May use soft collar at night. Basil Dess 06/21/2018, 8:47 AMPatient ID: Marie Richardson, female   DOB: 10-10-1960, 57 y.o.   MRN:  [id number]

## 2018-06-21 NOTE — Evaluation (Addendum)
Occupational Therapy Evaluation and Discharge Patient Details Name: Marie Richardson MRN: [id number] DOB: July 13, 1961 Today's Date: 06/21/2018    History of Present Illness 57 y.o female s/p ANTERIOR CERVICAL DISCECTOMY AND FUSION C4-5 AND C5-.6   Clinical Impression   This 57 yo female admitted and underwent above presents to acute OT with all education completed with pt and husband. We will D/C from acute OT. Called ortho tech and asked them to bring pt a new soft collar due to pt with emesis on current soft collar. Gave pt's husband to extra covers for new soft collar once it arrives.     Follow Up Recommendations  No OT follow up;Supervision/Assistance - 24 hour    Equipment Recommendations  None recommended by OT       Precautions / Restrictions Precautions Precautions: Cervical Precaution Booklet Issued: Yes (comment) Precaution Comments: Per orders can have off for bathing and in bed Required Braces or Orthoses: Cervical Brace Cervical Brace: Hard collar;Soft collar Restrictions Weight Bearing Restrictions: No Other Position/Activity Restrictions: Per MD note he prefers hard collar but if too claustrophobic can wear soft collar             ADL either performed or assessed with clinical judgement   ADL                                         General ADL Comments: Educated pt and husband on best UB clothing to wear, to not bend forward for LBD, using 2 cups for brushing teeth (one to spit and one rinse with straw), using cups with straws, use Philadelphia collar of showering if she wants to wear one in shower, how to change out pads on Aspen or vista collar, making sure when getting wet and rinising off in shower to get directly under shower head (not bendiing forward or backward into water)     Vision Patient Visual Report: No change from baseline              Pertinent Vitals/Pain Pain Assessment: Faces Faces Pain Scale: Hurts little  more Pain Location: neck Pain Descriptors / Indicators: Aching Pain Intervention(s): Monitored during session;Premedicated before session     Hand Dominance Right(uses LUE alot not due to RUE issues)   Extremity/Trunk Assessment Upper Extremity Assessment Upper Extremity Assessment: RUE deficits/detail RUE Deficits / Details: previous existing issues to this surgery     Communication Communication Communication: No difficulties   Cognition Arousal/Alertness: Lethargic;Suspect due to medications Behavior During Therapy: [facility] for tasks assessed/performed Overall Cognitive Status: Within Functional Limits for tasks assessed                                                Home Living Family/patient expects to be discharged to:: Private residence Living Arrangements: Spouse/significant other;Children Available Help at Discharge: Family;Available 24 hours/day Type of Home: House Home Access: Stairs to enter [employer] of Steps: 4 Entrance Stairs-Rails: Can reach both Home Layout: One level     Bathroom Shower/Tub: Tub/shower unit;Curtain   Bathroom Toilet: Standard     Home Equipment: None          Prior Functioning/Environment Level of Independence: Independent  OT Problem List: Decreased strength;Decreased range of motion;Pain;Impaired UE functional use         OT Goals(Current goals can be found in the care plan section) Acute Rehab OT Goals Patient Stated Goal: Go home today  OT Frequency:                AM-PAC PT "6 Clicks" Daily Activity     Outcome Measure Help from another person eating meals?: A Little Help from another person taking care of personal grooming?: A Little Help from another person toileting, which includes using toliet, bedpan, or urinal?: A Little Help from another person bathing (including washing, rinsing, drying)?: A Little Help from another person to put on and taking off regular  upper body clothing?: A Little Help from another person to put on and taking off regular lower body clothing?: A Little 6 Click Score: 18   End of Session Nurse Communication: (pt needs a vista collar from central supply per talking with ortho tech who had talked to Dr. Louanne Skye and said vista was fine instead of Aspen)  Activity Tolerance: Patient limited by lethargy Patient left: in bed;with call bell/phone within reach;with family/visitor present  OT Visit Diagnosis: Pain Pain - part of body: (neck)                Time: 1013-1040 OT Time Calculation (min): 27 min Charges:  OT General Charges $OT Visit: 1 Visit OT Evaluation $OT Eval Moderate Complexity: 1 Mod OT Treatments $Self Care/Home Management : 8-22 mins  [place] Circle, OTR/L Acute [employer] Pager [phone] Office [phone]     Almon Register 06/21/2018, 10:54 AM

## 2018-06-23 ENCOUNTER — Encounter (HOSPITAL_COMMUNITY): Payer: Self-pay | Admitting: Specialist

## 2018-06-23 MED FILL — Thrombin (Recombinant) For Soln 20000 Unit: CUTANEOUS | Qty: 1 | Status: AC

## 2018-06-23 NOTE — Anesthesia Postprocedure Evaluation (Signed)
Anesthesia Post Note  Patient: Marie Richardson  Procedure(s) Performed: ANTERIOR CERVICAL DISCECTOMY AND FUSION C4-5 AND C5-6 WITH PLATES, SCREWS, ALLOGRAFT BONE GRAFT AND LOCAL BONE GRAFT (N/A )     Patient location during evaluation: PACU Anesthesia Type: General Level of consciousness: awake and alert Pain management: pain level controlled Vital Signs Assessment: post-procedure vital signs reviewed and stable Respiratory status: spontaneous breathing, nonlabored ventilation, respiratory function stable and patient connected to nasal cannula oxygen Cardiovascular status: blood pressure returned to baseline and stable Postop Assessment: no apparent nausea or vomiting Anesthetic complications: no    Last Vitals:  Vitals:   06/21/18 0144 06/21/18 0433  BP: 138/78 (!) 155/74  Pulse: 86 86  Resp: 16 16  Temp: 36.9 C 37 C  SpO2: 97% 98%    Last Pain:  Vitals:   06/21/18 0652  TempSrc:   PainSc: 7                  Georgina Krist

## 2018-06-24 NOTE — Discharge Summary (Signed)
Physician Discharge Summary      Patient ID: Lajuana Patchell MRN: [id number] DOB/AGE: June 21, 1961 57 y.o.  Admit date: 06/20/2018 Discharge date: 06/21/2018  Admission Diagnoses:  Principal Problem:   Other spondylosis with radiculopathy, cervical region   Discharge Diagnoses:  Same  Past Medical History:  Diagnosis Date  . Arthritis    neck  . Asthma   . Complication of anesthesia    pt told she should always have a "pediatric intubation tube"  . 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 Endometriosis   . High cholesterol   . Pneumonia   . PONV (postoperative nausea and vomiting)     Surgeries: Procedure(s): ANTERIOR CERVICAL DISCECTOMY AND FUSION C4-5 AND C5-6 WITH PLATES, SCREWS, ALLOGRAFT BONE GRAFT AND LOCAL BONE GRAFT on 06/20/2018   Consultants:   Discharged Condition: Improved  Hospital Course: Loa Idler is an 57 y.o. female who was admitted 06/20/2018 with a chief complaint of No chief complaint on file. , and found to have a diagnosis of Other spondylosis with radiculopathy, cervical region.  She was brought to the operating room on 06/20/2018 and underwent the above named procedures.      She was given perioperative antibiotics:  Anti-infectives (From admission, onward)   Start     Dose/Rate Route Frequency Ordered Stop   06/20/18 2200  ceFAZolin (ANCEF) IVPB 2g/100 mL premix     2 g 200 mL/hr over 30 Minutes Intravenous Every 8 hours 06/20/18 1847 06/21/18 0628   06/20/18 1030  ceFAZolin (ANCEF) IVPB 2g/100 mL premix     2 g 200 mL/hr over 30 Minutes Intravenous On call to O.R. 06/20/18 1022 06/20/18 1352    She recovered uneventfully in the PACU and was transferred to [facility] [place]. Her TLS drain drained less than 1/2 a red top tube over the ensuing 14 hours. She showed some minimal weakness in right shoulder abduction post surgery. On POD #1 she was voiding since the surgery without difficulty, Able to stand and ambulate  independently. She tolerated a soft diet and po narcotic meds although she had nausea during the immediate post operative period. The TLS drain was removed with the suture used to sew it in place. She was afebrile and VSS. The incision left neck was dry and there was no swelling evident. She discharged home following PT.  She were given sequential compression devices, early ambulation, and chemoprophylaxis for DVT prophylaxis.  She benefited maximally from their hospital stay and there were no complications.    Recent vital signs:  Vitals:   06/21/18 0144 06/21/18 0433  BP: 138/78 (!) 155/74  Pulse: 86 86  Resp: 16 16  Temp: 98.4 F (36.9 C) 98.6 F (37 C)  SpO2: 97% 98%    Recent laboratory studies:  Results for orders placed or performed during the hospital encounter of 06/20/18  CBC  Result Value Ref Range   WBC 14.6 (H) 4.0 - 10.5 K/uL   RBC 4.07 3.87 - 5.11 MIL/uL   Hemoglobin 12.9 12.0 - 15.0 g/dL   HCT 38.1 36.0 - 46.0 %   MCV 93.6 80.0 - 100.0 fL   MCH 31.7 26.0 - 34.0 pg   MCHC 33.9 30.0 - 36.0 g/dL   RDW 11.6 11.5 - 15.5 %   Platelets 347 150 - 400 K/uL   nRBC 0.0 0.0 - 0.2 %    Discharge Medications:   Allergies as of 06/21/2018      Reactions   Other Diarrhea,  Nausea And Vomiting, Other (See Comments)   All seafood headache   Shellfish Allergy Other (See Comments)   Headache      Medication List    STOP taking these medications   diclofenac 50 MG tablet Commonly known as:  CATAFLAM   meloxicam 15 MG tablet Commonly known as:  MOBIC   traMADol 50 MG tablet Commonly known as:  ULTRAM     TAKE these medications   albuterol 108 (90 Base) MCG/ACT inhaler Commonly known as:  PROVENTIL HFA;VENTOLIN HFA Inhale 2 puffs into the lungs every 6 (six) hours as needed for wheezing or shortness of breath.   ALPRAZolam 0.5 MG tablet Commonly known as:  XANAX Take one tablet at the MRI and may repeat x 1.   Capsaicin-Menthol-Methyl Sal 0.025-1-12 %  Crea Apply to the right hand BID, keep away from mucous membranes or eye as the active ingredient can cause irritation   diphenhydrAMINE 25 mg capsule Commonly known as:  BENADRYL Take 25 mg by mouth at bedtime as needed for allergies.   diphenhydramine-acetaminophen 25-500 MG Tabs tablet Commonly known as:  TYLENOL PM Take 2 tablets by mouth at bedtime as needed.   docusate sodium 100 MG capsule Commonly known as:  COLACE Take 1 capsule (100 mg total) by mouth 2 (two) times daily.   estradiol 2 MG tablet Commonly known as:  ESTRACE Take 2 mg by mouth at bedtime.   Evolocumab 140 MG/ML Soaj Inject 140 mg into the skin every 14 (fourteen) days.   gabapentin 300 MG capsule Commonly known as:  NEURONTIN Take 1 capsule (300 mg total) by mouth 3 (three) times daily.   methocarbamol 500 MG tablet Commonly known as:  ROBAXIN Take 1 tablet (500 mg total) by mouth every 8 (eight) hours as needed for muscle spasms. What changed:  Another medication with the same name was removed. Continue taking this medication, and follow the directions you see here.   MULTIVITAMIN GUMMIES WOMENS PO Take 2 each by mouth daily.   oxyCODONE-acetaminophen 5-325 MG tablet Commonly known as:  PERCOCET/ROXICET Take 1 tablet by mouth every 6 (six) hours as needed for severe pain.   Vitamin D3 125 MCG (5000 UT) Caps Take 5,000 Units by mouth 2 (two) times daily.   zolpidem 10 MG tablet Commonly known as:  AMBIEN Take 5 mg by mouth at bedtime.       Diagnostic Studies: Dg Cervical Spine 2-3 Views  Result Date: 06/20/2018 CLINICAL DATA:  Intraoperative localization EXAM: CERVICAL SPINE - 2-3 VIEW COMPARISON:  None. FINDINGS: Three films were obtained intraoperatively during cervical fusion. Initial film shows needles in the anterior aspects of the C5-6 and C6-7 disc space. Subsequent image demonstrates needles in the anterior aspect of the C4-5 and C5-6 disc space. Subsequent interbody fusion at C4-5  and C5-6 with anterior fixation is noted. IMPRESSION: Intraoperative localization for cervical fusion. Electronically Signed   By: Inez Catalina M.D.   On: 06/20/2018 16:38    Disposition:   Discharge Instructions    Call MD / Call 911   Complete by:  As directed    If you experience chest pain or shortness of breath, CALL 911 and be transported to the hospital emergency room.  If you develope a fever above 101 F, pus (white drainage) or increased drainage or redness at the wound, or calf pain, call your surgeon's office.   Constipation Prevention   Complete by:  As directed    Drink plenty of fluids.  Prune juice may be helpful.  You may use a stool softener, such as Colace (over the counter) 100 mg twice a day.  Use MiraLax (over the counter) for constipation as needed.   Diet - low sodium heart healthy   Complete by:  As directed    Discharge instructions   Complete by:  As directed    No lifting greater than 10 lbs. No overhead use of arms. Avoid bending,and twisting neck. Walk in house for first week them may start to get out slowly increasing distance up to one mile by 3 weeks post op. Keep incision dry for 3 days, may then bathe and wet incision using a Philadelphia collar when showering. Call if any fevers >101, chills, or increasing numbness or weakness or increased swelling or drainage. I prefer an Aspen hard collar but if she is unable to tolerate due to claustrophobia then a soft collar will Have to do.  May use soft collar at night.   Driving restrictions   Complete by:  As directed    No driving for 4 weeks   Increase activity slowly as tolerated   Complete by:  As directed    Lifting restrictions   Complete by:  As directed    No lifting for 8 weeks      Follow-up Information    Jessy Oto, MD In 2 weeks.   Specialty:  Orthopedic Surgery Why:  For wound re-check Contact information: [place] [address] [place] Follow up.   Specialty:  Home Health Services Why:  HHPT Contact information: [address] [place] [address] [phone]            Signed: Basil Dess 06/24/2018, 3:01 PM

## 2018-06-25 ENCOUNTER — Encounter (INDEPENDENT_AMBULATORY_CARE_PROVIDER_SITE_OTHER): Payer: Self-pay | Admitting: Surgery

## 2018-06-25 ENCOUNTER — Ambulatory Visit (INDEPENDENT_AMBULATORY_CARE_PROVIDER_SITE_OTHER): Payer: PRIVATE HEALTH INSURANCE | Admitting: Surgery

## 2018-06-25 ENCOUNTER — Ambulatory Visit (INDEPENDENT_AMBULATORY_CARE_PROVIDER_SITE_OTHER): Payer: Self-pay

## 2018-06-25 VITALS — BP 137/85 | HR 107 | Temp 97.7°F | Ht 65.0 in | Wt 144.2 lb

## 2018-06-25 DIAGNOSIS — M542 Cervicalgia: Secondary | ICD-10-CM

## 2018-06-26 NOTE — Progress Notes (Signed)
Patient returns.  She is 5 days status post C4-5 and C5-6 ACDF.  States that her neck is doing well.  No complaints of dysphagia or difficulty breathing.  States that her preop right hand numbness and tingling is improved.  Exam patient seen with Dr. Louanne Skye today.  Surgical incision is healing very well.  No drainage or signs of infection.  Not much by the way of swelling around the incision.  No acute distress.  Gait is normal.     Plan Patient will continue cervical collar.  Follow-up in 4 weeks with Dr. Louanne Skye for recheck.  States that she has an appointment scheduled.  No lifting, pushing, pulling, driving.

## 2018-06-30 ENCOUNTER — Telehealth (INDEPENDENT_AMBULATORY_CARE_PROVIDER_SITE_OTHER): Payer: Self-pay | Admitting: Radiology

## 2018-06-30 NOTE — Telephone Encounter (Signed)
Freda Munro from [facility], is requesting verbal orders for 2 visits per week for 3 weeks.---Call back number is [phone].---I Called and gave verbal auth for patient to rec'e HHPT

## 2018-07-08 ENCOUNTER — Telehealth (INDEPENDENT_AMBULATORY_CARE_PROVIDER_SITE_OTHER): Payer: Self-pay | Admitting: Specialist

## 2018-07-08 NOTE — Telephone Encounter (Signed)
Pt is asking to speak to [facility] regarding her home health care. She is having issues with them not showing up ect.  PT # [phone]

## 2018-07-08 NOTE — Telephone Encounter (Signed)
[place]

## 2018-07-10 NOTE — Telephone Encounter (Signed)
I called and [facility] is going to be reaching out to her and will be contacting the scheduler to see what is going on.I called and advised patient and I gave her their phone number to call and talk to them, she will wait till 4 for them to call her and if they don't she will call them

## 2018-07-22 ENCOUNTER — Telehealth (INDEPENDENT_AMBULATORY_CARE_PROVIDER_SITE_OTHER): Payer: Self-pay

## 2018-07-22 NOTE — Telephone Encounter (Signed)
I called and worked patient in so that we could check her out just to make sure nothing is wrong, she agreed and she is scheduled for 07/23/18 @ 930

## 2018-07-22 NOTE — Telephone Encounter (Signed)
Patient states that she fell last night and landed on her right hip and her recliner turned over and landed on her. She states that she is "sore all over" but that she is not having any neck pain but she is worried and wanted to make you aware and advise if she needs to come in sooner that her post op appt. She states she is not having any neck pain ( or no increase since her surgery) but just wanted to notify us incase she needs to come in sooner. Please call pt to advise.

## 2018-07-23 ENCOUNTER — Ambulatory Visit (INDEPENDENT_AMBULATORY_CARE_PROVIDER_SITE_OTHER): Payer: PRIVATE HEALTH INSURANCE | Admitting: Surgery

## 2018-07-23 ENCOUNTER — Encounter (INDEPENDENT_AMBULATORY_CARE_PROVIDER_SITE_OTHER): Payer: Self-pay | Admitting: Surgery

## 2018-07-23 ENCOUNTER — Ambulatory Visit (INDEPENDENT_AMBULATORY_CARE_PROVIDER_SITE_OTHER): Payer: Self-pay

## 2018-07-23 VITALS — BP 131/80 | HR 94 | Temp 97.6°F | Ht 65.0 in | Wt 144.2 lb

## 2018-07-23 DIAGNOSIS — M542 Cervicalgia: Secondary | ICD-10-CM

## 2018-07-23 MED ORDER — HYDROCODONE-ACETAMINOPHEN 7.5-325 MG PO TABS: 1.0000 | ORAL_TABLET | Freq: Four times a day (QID) | ORAL | 0 refills | Status: DC | PRN

## 2018-07-23 MED ORDER — METHOCARBAMOL 500 MG PO TABS: 500.0000 mg | ORAL_TABLET | Freq: Four times a day (QID) | ORAL | 0 refills | Status: DC | PRN

## 2018-07-23 NOTE — Progress Notes (Signed)
57 year old white female who is a few weeks status post C4-5 and C5-6 ACDF returns.  States that she fell on Monday out of her recliner.  States that she went to sit down and recliner tipped over to the right side but she was able to brace herself.  Was also wearing her collar.  Complain of some scapular spasms.  No radicular pain down her arm.  No issues with her legs.   Exam Pleasant white female alert and oriented in no acute distress.  Surgical incision has healed well.  Neurologically intact.   X-rays Show that her hardware is intact.  No complicating features.    Plan Advised patient that everything looks good at this point.  Follow with Dr. Louanne Skye as scheduled in a week or so.  Refill prescriptions for Norco and Robaxin.

## 2018-07-31 ENCOUNTER — Ambulatory Visit (INDEPENDENT_AMBULATORY_CARE_PROVIDER_SITE_OTHER): Payer: PRIVATE HEALTH INSURANCE | Admitting: Specialist

## 2018-08-01 ENCOUNTER — Ambulatory Visit (INDEPENDENT_AMBULATORY_CARE_PROVIDER_SITE_OTHER): Payer: PRIVATE HEALTH INSURANCE

## 2018-08-01 ENCOUNTER — Ambulatory Visit (INDEPENDENT_AMBULATORY_CARE_PROVIDER_SITE_OTHER): Payer: PRIVATE HEALTH INSURANCE | Admitting: Orthopaedic Surgery

## 2018-08-01 ENCOUNTER — Encounter (INDEPENDENT_AMBULATORY_CARE_PROVIDER_SITE_OTHER): Payer: Self-pay | Admitting: Orthopaedic Surgery

## 2018-08-01 ENCOUNTER — Telehealth (INDEPENDENT_AMBULATORY_CARE_PROVIDER_SITE_OTHER): Payer: Self-pay

## 2018-08-01 VITALS — BP 114/79 | HR 96 | Ht 65.0 in | Wt 135.0 lb

## 2018-08-01 DIAGNOSIS — Z981 Arthrodesis status: Secondary | ICD-10-CM

## 2018-08-01 NOTE — Telephone Encounter (Signed)
I called and spoke with patient she was confused by what Dr. Lorin Mercy had told her to do, she was told that she should come out of collar, that she can drive, and go on about her way.  She states that she does not feel this was the plan that Dr. Louanne Skye had for her as she was told by Dr. Louanne Skye she would be in the collar for 8-16 weeks. And the therapist was there today and told her to call us back.  I advised her to continue what she has been doing and if she had any questions or concerns she can call us.

## 2018-08-01 NOTE — Telephone Encounter (Signed)
Patient called asking to speak to you. Advised her I would leave a message. She didn't give me no details besides she needs to speak with [facility]. Please call patient. Thank you.    CB [phone]

## 2018-08-01 NOTE — Progress Notes (Signed)
Post-Op Visit Note   Patient: Marie Richardson           Date of Birth: October 08, 1960           MRN: [id number] Visit Date: 08/01/2018 PCP: Wayland Salinas, MD   Assessment & Plan:  Chief Complaint:  Chief Complaint  Patient presents with  . Neck - Follow-up    06/20/18 C4-5, C5-6 ACDF, Allograft, Plate   Visit Diagnoses:  1. Status post cervical spinal fusion     Plan: I reviewed x-rays with her next fusion looks solid.  She will follow-up with Dr. Merrilee Seashore who is out of the office for 2 weeks due to a family emergency.  She has a soft collar at home she can wear intermittently for a few days since her neck is slightly sore.  She has appointment with Dr. 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  She remains in hand therapy and still having problems ulnar side of her right hand and ring and long finger.  When she still out of work and is still taking her Neurontin.  Follow-Up Instructions: Return in about 1 month (around 09/01/2018). with Dr. Louanne Skye  Orders:  Orders Placed This Encounter  Procedures  . XR Cervical Spine 2 or 3 views   No orders of the defined types were placed in this encounter.   Imaging: Xr Cervical Spine 2 Or 3 Views  Result Date: 08/01/2018 AP and lateral cervical spine flexion extension x-rays were reviewed after being obtained.  This shows C4-5, C5-6 anterior cervical discectomy and fusion with allograft and plate.  No motion is noted on flexion-extension. Impression: Satisfactory 2 level cervical fusion at 6 weeks with no motion.   PMFS History: Patient Active Problem List   Diagnosis Date Noted  . S/P cervical spinal fusion 06/20/2018  . Status post laminectomy 03/26/2017  . Other spondylosis with radiculopathy, cervical region 03/26/2017    Class: Temporary   Past Medical History:  Diagnosis Date  . Arthritis    neck  . Asthma   . Complication of anesthesia    pt told she should always have a "pediatric  intubation tube"  . 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 Endometriosis   . High cholesterol   . Pneumonia   . PONV (postoperative nausea and vomiting)     Family History  Problem Relation Age of Onset  . Heart attack Mother   . Arthritis Mother   . High Cholesterol Mother   . High Cholesterol Father   . Hypertension Father   . Heart attack Father     Past Surgical History:  Procedure Laterality Date  . ABDOMINAL HYSTERECTOMY  2000  . ANTERIOR CERVICAL DECOMP/DISCECTOMY FUSION N/A 06/20/2018   Procedure: ANTERIOR CERVICAL DISCECTOMY AND FUSION C4-5 AND C5-6 WITH PLATES, SCREWS, ALLOGRAFT BONE GRAFT AND LOCAL BONE GRAFT;  Surgeon: Jessy Oto, MD;  [facility];  Service: Orthopedics;  Laterality: N/A;  . APPENDECTOMY     secondary to endometriosis  . CARPAL TUNNEL RELEASE Right   . COLONOSCOPY    . LAPAROSCOPIC ENDOMETRIOSIS FULGURATION     pt had 7 surgeries related to endometriosis  . POSTERIOR CERVICAL FUSION/FORAMINOTOMY N/A 03/26/2017   Procedure: Right C5-6 and C6-7 Foraminotomies;  Surgeon: Jessy Oto, MD;  Location: [place];  Service: Orthopedics;  Laterality: N/A;  . ULNAR TUNNEL RELEASE Right 09/19/2017   Social History   Occupational History  . Not on file  Tobacco Use  . Smoking status: Never Smoker  . Smokeless tobacco: Never Used  Substance and Sexual Activity  . Alcohol use: Yes    Comment: very rarely  . Drug use: No  . Sexual activity: Not on file

## 2018-08-13 ENCOUNTER — Ambulatory Visit (INDEPENDENT_AMBULATORY_CARE_PROVIDER_SITE_OTHER): Payer: PRIVATE HEALTH INSURANCE | Admitting: Specialist

## 2018-08-21 ENCOUNTER — Encounter (INDEPENDENT_AMBULATORY_CARE_PROVIDER_SITE_OTHER): Payer: Self-pay | Admitting: Specialist

## 2018-08-21 ENCOUNTER — Ambulatory Visit (INDEPENDENT_AMBULATORY_CARE_PROVIDER_SITE_OTHER): Payer: PRIVATE HEALTH INSURANCE

## 2018-08-21 ENCOUNTER — Ambulatory Visit (INDEPENDENT_AMBULATORY_CARE_PROVIDER_SITE_OTHER): Payer: PRIVATE HEALTH INSURANCE | Admitting: Specialist

## 2018-08-21 VITALS — BP 118/71 | HR 85 | Ht 65.0 in | Wt 135.0 lb

## 2018-08-21 DIAGNOSIS — M4322 Fusion of spine, cervical region: Secondary | ICD-10-CM | POA: Diagnosis not present

## 2018-08-21 NOTE — Progress Notes (Signed)
   Post-Op Visit Note   Patient: Marie Richardson           Date of Birth: 1960-10-02           MRN: [id number] Visit Date: 08/21/2018 PCP: Wayland Salinas, MD   Assessment & Plan:8 weeks post ACDF C45-6 and C6-7 Chief Complaint:  Chief Complaint  Patient presents with  . Neck - Follow-up  Taking 1/2 hydrocodone and one methocarbamal for the  Incisiion is healed Tender right mid medial arm. There is right ulna numbness and paresthesias. There is spotchy discoloration of the right ulnar two digits  Motor Biceps strength  Visit Diagnoses:  1. Cervical vertebral fusion     Plan: Avoid overhead lifting and overhead use of the arms. Do not lift greater than 5 lbs. Adjust head rest in vehicle to prevent hyperextension if rear ended. Stop the use of the right wrist brace, use  for periods of increase risk of fall or local physical trauma. Wear soft collar and may go without a collar at night    Follow-Up Instructions: No follow-ups on file.   Orders:  Orders Placed This Encounter  Procedures  . XR Cervical Spine 2 or 3 views   No orders of the defined types were placed in this encounter.   Imaging: No results found.  PMFS History: Patient Active Problem List   Diagnosis Date Noted  . Other spondylosis with radiculopathy, cervical region 03/26/2017    Priority: High    Class: Temporary  . S/P cervical spinal fusion 06/20/2018  . Status post laminectomy 03/26/2017   Past Medical History:  Diagnosis Date  . Arthritis    neck  . Asthma   . Complication of anesthesia    pt told she should always have a "pediatric intubation tube"  . 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 Endometriosis   . High cholesterol   . Pneumonia   . PONV (postoperative nausea and vomiting)     Family History  Problem Relation Age of Onset  . Heart attack Mother   . Arthritis Mother   . High Cholesterol Mother   . High Cholesterol Father   . Hypertension  Father   . Heart attack Father     Past Surgical History:  Procedure Laterality Date  . ABDOMINAL HYSTERECTOMY  2000  . ANTERIOR CERVICAL DECOMP/DISCECTOMY FUSION N/A 06/20/2018   Procedure: ANTERIOR CERVICAL DISCECTOMY AND FUSION C4-5 AND C5-6 WITH PLATES, SCREWS, ALLOGRAFT BONE GRAFT AND LOCAL BONE GRAFT;  Surgeon: Jessy Oto, MD;  Location: [place];  Service: Orthopedics;  Laterality: N/A;  . APPENDECTOMY     secondary to endometriosis  . CARPAL TUNNEL RELEASE Right   . COLONOSCOPY    . LAPAROSCOPIC ENDOMETRIOSIS FULGURATION     pt had 7 surgeries related to endometriosis  . POSTERIOR CERVICAL FUSION/FORAMINOTOMY N/A 03/26/2017   Procedure: Right C5-6 and C6-7 Foraminotomies;  Surgeon: Jessy Oto, MD;  Location: [place];  Service: Orthopedics;  Laterality: N/A;  . ULNAR TUNNEL RELEASE Right 09/19/2017   Social History   Occupational History  . Not on file  Tobacco Use  . Smoking status: Never Smoker  . Smokeless tobacco: Never Used  Substance and Sexual Activity  . Alcohol use: Yes    Comment: very rarely  . Drug use: No  . Sexual activity: Not on file

## 2018-08-21 NOTE — Patient Instructions (Signed)
Plan: Avoid overhead lifting and overhead use of the arms. Do not lift greater than 5 lbs. Adjust head rest in vehicle to prevent hyperextension if rear ended. Stop the use of the right wrist brace, use  for periods of increase risk of fall or local physical trauma. Wear soft collar and may go without a collar at night

## 2018-08-22 ENCOUNTER — Encounter (INDEPENDENT_AMBULATORY_CARE_PROVIDER_SITE_OTHER): Payer: Self-pay | Admitting: Specialist

## 2018-08-26 ENCOUNTER — Telehealth (INDEPENDENT_AMBULATORY_CARE_PROVIDER_SITE_OTHER): Payer: Self-pay | Admitting: Radiology

## 2018-08-26 NOTE — Telephone Encounter (Signed)
Yesterday Dr Louanne Skye said for me to stop wearing the hard cervical collar and start wearing the soft cervical collar. My head feels like it weighs 100 lbs and it feels like I am straining to be upright. It is exhausting. I can't sit or stand for a small period of time. Is this normal?-----Please advise

## 2018-09-19 ENCOUNTER — Ambulatory Visit (INDEPENDENT_AMBULATORY_CARE_PROVIDER_SITE_OTHER): Payer: PRIVATE HEALTH INSURANCE | Admitting: Specialist

## 2018-09-19 ENCOUNTER — Encounter (INDEPENDENT_AMBULATORY_CARE_PROVIDER_SITE_OTHER): Payer: Self-pay | Admitting: Specialist

## 2018-09-19 ENCOUNTER — Ambulatory Visit (INDEPENDENT_AMBULATORY_CARE_PROVIDER_SITE_OTHER): Payer: PRIVATE HEALTH INSURANCE

## 2018-09-19 VITALS — BP 117/71 | HR 86 | Ht 65.0 in | Wt 135.0 lb

## 2018-09-19 DIAGNOSIS — M4322 Fusion of spine, cervical region: Secondary | ICD-10-CM | POA: Diagnosis not present

## 2018-09-19 DIAGNOSIS — R202 Paresthesia of skin: Secondary | ICD-10-CM

## 2018-09-19 DIAGNOSIS — R200 Anesthesia of skin: Secondary | ICD-10-CM

## 2018-09-19 MED ORDER — DICLOFENAC SODIUM 1 % TD GEL: 2.0000 g | Freq: Four times a day (QID) | TRANSDERMAL | 3 refills | Status: AC

## 2018-09-19 MED ORDER — METHOCARBAMOL 500 MG PO TABS: 500.0000 mg | ORAL_TABLET | Freq: Three times a day (TID) | ORAL | 0 refills | Status: DC | PRN

## 2018-09-19 MED ORDER — MELOXICAM 7.5 MG PO TABS: 7.5000 mg | ORAL_TABLET | Freq: Two times a day (BID) | ORAL | 3 refills | Status: DC

## 2018-09-19 NOTE — Progress Notes (Signed)
Office Visit Note   Patient: Marie Richardson           Date of Birth: [birth year]           MRN: [id number] Visit Date: 09/19/2018              Requested by: Wayland Salinas, MD [address] [place], [place] [address] PCP: Wayland Salinas, MD   Assessment & Plan: Visit Diagnoses:  1. Cervical vertebral fusion   2. Numbness and tingling of right arm    Incision is healed, Cervical ROM is limited by 40%   Plan: Avoid overhead lifting and overhead use of the arms. Do not lift greater than 5 lbs. Adjust head rest in vehicle to prevent hyperextension if rear ended. Take extra precautions to avoid falling. Wean from the cervical collar Guitar gripper. Voltaren gel for hand stiffness. Warm soaks.  Follow-Up Instructions: Return in about 6 weeks (around 10/31/2018).   Orders:  Orders Placed This Encounter  Procedures  . XR Cervical Spine 2 or 3 views   Meds ordered this encounter  Medications  . meloxicam (MOBIC) 7.5 MG tablet    Sig: Take 1 tablet (7.5 mg total) by mouth 2 (two) times daily after a meal.    Dispense:  60 tablet    Refill:  3  . methocarbamol (ROBAXIN) 500 MG tablet    Sig: Take 1 tablet (500 mg total) by mouth every 8 (eight) hours as needed for muscle spasms.    Dispense:  60 tablet    Refill:  0  . diclofenac sodium (VOLTAREN) 1 % GEL    Sig: Apply 2 g topically 4 (four) times daily.    Dispense:  3 Tube    Refill:  3      Procedures: No procedures performed   Clinical Data: No additional findings.   Subjective: Chief Complaint  Patient presents with  . Neck - Follow-up    HPI  Review of Systems  Constitutional: Negative.   HENT: Negative.   Eyes: Positive for itching.  Respiratory: Negative for apnea, cough, chest tightness, shortness of breath and wheezing.   Cardiovascular: Negative.  Negative for palpitations and leg swelling.  Gastrointestinal: Negative.  Negative for abdominal distention and anal  bleeding.  Endocrine: Negative.   Genitourinary: Negative.   Musculoskeletal: Positive for neck pain and neck stiffness. Negative for arthralgias, back pain, gait problem and joint swelling.  Skin: Negative.  Negative for color change, pallor, rash and wound.  Allergic/Immunologic: Negative.  Negative for environmental allergies, food allergies and immunocompromised state.  Neurological: Positive for weakness and numbness. Negative for dizziness, tremors, seizures, syncope, facial asymmetry, speech difficulty, light-headedness and headaches.  Hematological: Negative.  Negative for adenopathy. Does not bruise/bleed easily.  Psychiatric/Behavioral: Negative.  Negative for agitation, behavioral problems, confusion, decreased concentration, dysphoric mood, hallucinations, self-injury, sleep disturbance and suicidal ideas. The patient is not nervous/anxious and is not hyperactive.      Objective: Vital Signs: BP 117/71 (BP Location: Left Arm, Patient Position: Sitting)   Pulse 86   Ht 5\' 5"  (1.651 m)   Wt 135 lb (61.2 kg)   BMI 22.47 kg/m   Physical Exam Constitutional:      Appearance: She is well-developed.  HENT:     Head: Normocephalic and atraumatic.  Eyes:     Pupils: Pupils are equal, round, and reactive to light.  Neck:     Musculoskeletal: Normal range of motion and neck supple.  Pulmonary:  Effort: Pulmonary effort is normal.     Breath sounds: Normal breath sounds.  Abdominal:     General: Bowel sounds are normal.     Palpations: Abdomen is soft.  Skin:    General: Skin is warm and dry.  Neurological:     Mental Status: She is alert and oriented to person, place, and time.  Psychiatric:        Behavior: Behavior normal.        Thought Content: Thought content normal.        Judgment: Judgment normal.     Back Exam   Tenderness  The patient is experiencing tenderness in the cervical.  Range of Motion  Extension:  70 normal  Flexion:  70 abnormal  Lateral  bend right:  60 abnormal  Lateral bend left:  60 abnormal  Rotation right: 60  Rotation left: 60   Muscle Strength  Right Quadriceps:  5/5  Left Quadriceps:  5/5  Right Hamstrings:  5/5  Left Hamstrings:  5/5   Tests  Straight leg raise right: negative Straight leg raise left: negative  Reflexes  Patellar: normal Achilles: normal Biceps: normal Babinski's sign: normal   Other  Toe walk: normal Heel walk: normal Sensation: normal Gait: normal  Erythema: no back redness Scars: absent      Specialty Comments:  No specialty comments available.  Imaging: Xr Cervical Spine 2 Or 3 Views  Result Date: 09/19/2018 AP and lateral flexion and extension radiographs show less than one mm of movement C4 to C6. Hardware and bone plugs are in good position and alignment.    PMFS History: Patient Active Problem List   Diagnosis Date Noted  . Other spondylosis with radiculopathy, cervical region 03/26/2017    Priority: High    Class: Temporary  . S/P cervical spinal fusion 06/20/2018  . Status post laminectomy 03/26/2017   Past Medical History:  Diagnosis Date  . Arthritis    neck  . Asthma   . Complication of anesthesia    pt told she should always have a "pediatric intubation tube"  . 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 Endometriosis   . High cholesterol   . Pneumonia   . PONV (postoperative nausea and vomiting)     Family History  Problem Relation Age of Onset  . Heart attack Mother   . Arthritis Mother   . High Cholesterol Mother   . High Cholesterol Father   . Hypertension Father   . Heart attack Father     Past Surgical History:  Procedure Laterality Date  . ABDOMINAL HYSTERECTOMY  2000  . ANTERIOR CERVICAL DECOMP/DISCECTOMY FUSION N/A 06/20/2018   Procedure: ANTERIOR CERVICAL DISCECTOMY AND FUSION C4-5 AND C5-6 WITH PLATES, SCREWS, ALLOGRAFT BONE GRAFT AND LOCAL BONE GRAFT;  Surgeon: Jessy Oto, MD;  Location: [place];   Service: Orthopedics;  Laterality: N/A;  . APPENDECTOMY     secondary to endometriosis  . CARPAL TUNNEL RELEASE Right   . COLONOSCOPY    . LAPAROSCOPIC ENDOMETRIOSIS FULGURATION     pt had 7 surgeries related to endometriosis  . POSTERIOR CERVICAL FUSION/FORAMINOTOMY N/A 03/26/2017   Procedure: Right C5-6 and C6-7 Foraminotomies;  Surgeon: Jessy Oto, MD;  Location: [facility];  Service: Orthopedics;  Laterality: N/A;  . ULNAR TUNNEL RELEASE Right 09/19/2017   Social History   Occupational History  . Not on file  Tobacco Use  . Smoking status: Never Smoker  . Smokeless tobacco: Never Used  Substance and Sexual Activity  . Alcohol use: Yes    Comment: very rarely  . Drug use: No  . Sexual activity: Not on file

## 2018-09-19 NOTE — Patient Instructions (Addendum)
Avoid overhead lifting and overhead use of the arms. Do not lift greater than 5 lbs. Adjust head rest in vehicle to prevent hyperextension if rear ended. Take extra precautions to avoid falling. Wean from the cervical collar Guitar gripper. Voltaren gel for hand stiffness. Warm soaks.

## 2018-10-24 ENCOUNTER — Encounter (INDEPENDENT_AMBULATORY_CARE_PROVIDER_SITE_OTHER): Payer: Self-pay | Admitting: Specialist

## 2018-10-24 ENCOUNTER — Ambulatory Visit (INDEPENDENT_AMBULATORY_CARE_PROVIDER_SITE_OTHER): Payer: PRIVATE HEALTH INSURANCE | Admitting: Specialist

## 2018-10-24 ENCOUNTER — Other Ambulatory Visit: Payer: Self-pay

## 2018-10-24 VITALS — BP 115/78 | HR 81 | Ht 65.0 in | Wt 135.0 lb

## 2018-10-24 DIAGNOSIS — G5621 Lesion of ulnar nerve, right upper limb: Secondary | ICD-10-CM | POA: Diagnosis not present

## 2018-10-24 DIAGNOSIS — M5412 Radiculopathy, cervical region: Secondary | ICD-10-CM | POA: Diagnosis not present

## 2018-10-24 DIAGNOSIS — M4322 Fusion of spine, cervical region: Secondary | ICD-10-CM | POA: Diagnosis not present

## 2018-10-24 DIAGNOSIS — G5641 Causalgia of right upper limb: Secondary | ICD-10-CM

## 2018-10-24 MED ORDER — GABAPENTIN 300 MG PO CAPS: 300.0000 mg | ORAL_CAPSULE | Freq: Three times a day (TID) | ORAL | 3 refills | Status: DC

## 2018-10-24 NOTE — Patient Instructions (Signed)
Avoid overhead lifting and overhead use of the arms. Do not lift greater than 5 lbs. Adjust head rest in vehicle to prevent hyperextension if rear ended. Take extra precautions to avoid falling.. Exercise the right arm as much as possible, use gloves with right arm . Avoid extremes in temperature and vibration.

## 2018-10-24 NOTE — Progress Notes (Signed)
Office Visit Note   Patient: Marie Richardson           Date of Birth: 1961/04/21           MRN: [id number] Visit Date: 10/24/2018              Requested by: Wayland Salinas, MD [address] [place], [place] [address] PCP: Wayland Salinas, MD   Assessment & Plan: Visit Diagnoses:  1. Complex regional pain syndrome type 2 of right upper extremity   2. Ulnar neuropathy at elbow of right upper extremity   3. Cervical vertebral fusion   4. Radiculopathy, cervical region     Plan: Avoid overhead lifting and overhead use of the arms. Do not lift greater than 5 lbs. Adjust head rest in vehicle to prevent hyperextension if rear ended. Take extra precautions to avoid falling.. Exercise the right arm as much as possible, use gloves with right arm . Avoid extremes in temperature and vibration.   Follow-Up Instructions: Return in about 3 months (around 01/24/2019).   Orders:  Orders Placed This Encounter  Procedures  . Ambulatory referral to Orthopedic Surgery   Meds ordered this encounter  Medications  . gabapentin (NEURONTIN) 300 MG capsule    Sig: Take 1 capsule (300 mg total) by mouth 3 (three) times daily.    Dispense:  90 capsule    Refill:  3    Please consider 90 day supplies to promote better adherence      Procedures: No procedures performed   Clinical Data: No additional findings.   Subjective: Chief Complaint  Patient presents with  . Neck - Follow-up    06/20/18 ACDF C4-5, C82-45    58 year old female right handed female with history of cervical fusion C4-5 and C5-6 for spondylosis. She has had right wrist and hand ulnar n. Changes. Has pain right wrist and hand with extension of the wrist. She has a  rehabilitation specialist requesting information about her diagnosis and the prognosis. She has burning sensation along the right medial forearm, wrist and upper arm and into the right ring and little finger with burning sensation  into the right palmar hypothenar eminence. With the right arm on the sofa there is pain along the right medial forearm and arm. No headaches or bowel or bladder isues. Walking she feels pain in The mid thoracic para thoracic area on the right side.     Review of Systems  Constitutional: Negative for activity change, appetite change, chills, diaphoresis, fatigue, fever and unexpected weight change.  HENT: Negative for congestion, dental problem, drooling, ear discharge, ear pain, facial swelling, hearing loss, mouth sores, nosebleeds, postnasal drip, rhinorrhea, sinus pressure and sinus pain.   Eyes: Negative for photophobia, pain, discharge, redness, itching and visual disturbance.  Respiratory: Negative for apnea, cough, choking, chest tightness, shortness of breath, wheezing and stridor.   Cardiovascular: Negative for chest pain, palpitations and leg swelling.  Gastrointestinal: Negative for abdominal distention, abdominal pain, anal bleeding, blood in stool, constipation, diarrhea, nausea, rectal pain and vomiting.  Endocrine: Negative for cold intolerance, heat intolerance, polydipsia, polyphagia and polyuria.  Genitourinary: Negative for decreased urine volume, difficulty urinating, dyspareunia, dysuria, enuresis, flank pain, frequency, genital sores, hematuria, menstrual problem, pelvic pain and urgency.  Musculoskeletal: Negative for arthralgias, back pain, gait problem, joint swelling, myalgias, neck pain and neck stiffness.  Allergic/Immunologic: Negative for environmental allergies, food allergies and immunocompromised state.  Neurological: Negative for dizziness, tremors, seizures, syncope, facial asymmetry, speech  difficulty, weakness, light-headedness, numbness and headaches.  Hematological: Negative for adenopathy. Does not bruise/bleed easily.  Psychiatric/Behavioral: Negative for agitation, behavioral problems, confusion, decreased concentration, dysphoric mood, hallucinations,  self-injury, sleep disturbance and suicidal ideas. The patient is not nervous/anxious and is not hyperactive.      Objective: Vital Signs: BP 115/78   Pulse 81   Ht 5\' 5"  (1.651 m)   Wt 135 lb (61.2 kg)   BMI 22.47 kg/m   Physical Exam  Ortho Exam  Specialty Comments:  No specialty comments available.  Imaging: No results found.   PMFS History: Patient Active Problem List   Diagnosis Date Noted  . Other spondylosis with radiculopathy, cervical region 03/26/2017    Priority: High    Class: Temporary  . S/P cervical spinal fusion 06/20/2018  . Status post laminectomy 03/26/2017   Past Medical History:  Diagnosis Date  . Arthritis    neck  . Asthma   . Complication of anesthesia    pt told she should always have a "pediatric intubation tube"  . 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 Endometriosis   . High cholesterol   . Pneumonia   . PONV (postoperative nausea and vomiting)     Family History  Problem Relation Age of Onset  . Heart attack Mother   . Arthritis Mother   . High Cholesterol Mother   . High Cholesterol Father   . Hypertension Father   . Heart attack Father     Past Surgical History:  Procedure Laterality Date  . ABDOMINAL HYSTERECTOMY  2000  . ANTERIOR CERVICAL DECOMP/DISCECTOMY FUSION N/A 06/20/2018   Procedure: ANTERIOR CERVICAL DISCECTOMY AND FUSION C4-5 AND C5-6 WITH PLATES, SCREWS, ALLOGRAFT BONE GRAFT AND LOCAL BONE GRAFT;  Surgeon: Jessy Oto, MD;  Location: [facility];  Service: Orthopedics;  Laterality: N/A;  . APPENDECTOMY     secondary to endometriosis  . CARPAL TUNNEL RELEASE Right   . COLONOSCOPY    . LAPAROSCOPIC ENDOMETRIOSIS FULGURATION     pt had 7 surgeries related to endometriosis  . POSTERIOR CERVICAL FUSION/FORAMINOTOMY N/A 03/26/2017   Procedure: Right C5-6 and C6-7 Foraminotomies;  Surgeon: Jessy Oto, MD;  Location: [facility];  Service: Orthopedics;  Laterality: N/A;  . ULNAR TUNNEL RELEASE Right  09/19/2017   Social History   Occupational History  . Not on file  Tobacco Use  . Smoking status: Never Smoker  . Smokeless tobacco: Never Used  Substance and Sexual Activity  . Alcohol use: Yes    Comment: very rarely  . Drug use: No  . Sexual activity: Not on file

## 2018-10-29 ENCOUNTER — Encounter (INDEPENDENT_AMBULATORY_CARE_PROVIDER_SITE_OTHER): Payer: Self-pay | Admitting: Specialist

## 2018-12-09 ENCOUNTER — Encounter: Payer: Self-pay | Admitting: Specialist

## 2018-12-22 ENCOUNTER — Encounter: Payer: Self-pay | Admitting: Specialist

## 2018-12-22 ENCOUNTER — Other Ambulatory Visit: Payer: Self-pay

## 2018-12-22 ENCOUNTER — Ambulatory Visit: Payer: PRIVATE HEALTH INSURANCE | Admitting: Specialist

## 2018-12-22 VITALS — BP 134/86 | HR 86 | Ht 65.0 in | Wt 142.0 lb

## 2018-12-22 DIAGNOSIS — M79601 Pain in right arm: Secondary | ICD-10-CM

## 2018-12-22 DIAGNOSIS — G540 Brachial plexus disorders: Secondary | ICD-10-CM | POA: Diagnosis not present

## 2018-12-22 DIAGNOSIS — M25511 Pain in right shoulder: Secondary | ICD-10-CM

## 2018-12-22 NOTE — Progress Notes (Addendum)
Office Visit Note   Patient: Marie Richardson           Date of Birth: June 16, 1961           MRN: [id number] Visit Date: 12/22/2018              Requested by: Wayland Salinas, MD [address] [place], [place] [id number] PCP: Wayland Salinas, MD   Assessment & Plan: Visit Diagnoses:  1. Right shoulder pain, unspecified chronicity   2. Right arm pain   58 year old right handed female with chronic right upper extremity pain complaints. Ulnar distribution numbness right hand and complaints of swelling and erythrema that is intermittant into the distal right radial forearm and hand. 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 Had right carpal tunnel release with debridement of the right  Volar wrist for cacific volar capsule and flexor tendon changes. Post debridement of the wrist with CTR she has had persistent numbness in the right ulnar hand numbness and pain.  Seen by Dr. Fredna Dow for second opinion and he did recommend proceeding with right ulnar  n transpostion at the elbow. She has had stiffness in the wrist with decreased dorsiflexion of th right wrist. Her ACDF went on to unit uneventfully but she still remains painful. A referral to  [facility] to Dr. Casper Harrison and she was seen by Dr. Roberts Gaudy.   Plan: Thoracic outlet syndrome TOS usually responds to conservative management about 95% of the time. Studies to assess for TOS include noninvasive doppler arterial studies and Venous doppler test, also CT angiograms to assess anatomy of the arterial system that is in Close approximation to the nerves as they leave the neck and enter the right arm. Thoracic outlet syndrome is a diagnosis partly of exclusion but should show substantial findings on EMG testing and be demonstrably abnormal before considering surgery. Therapy is recommended and follow up  EMG/NCV. Right arm doppler venous study to assess for compression due to TOS. Rule out a rheumatologic cause of right UE pain or arteritis.  Follow-Up Instructions: No follow-ups on file.   Orders:  Orders Placed This Encounter  Procedures   Sed Rate (ESR)   CRP High sensitivity   Antinuclear Antib (ANA)   Rheumatoid Factor   No orders of the defined types were placed in this encounter.     Procedures: No procedures performed   Clinical Data: No additional findings.   Subjective: Chief Complaint  Patient presents with   Right Arm - Follow-up    Patient wanted to discuss the suggestions that Dr. Nicoletta Dress made to her.    58 year old right handed female with persistent right ue pain intermittant swelling and concerns of right arm and shoulder pain. Seen by Dr. Roberts Gaudy at [facility] hand specialist and believes that she may have a thoracic outlet syndrome. Numbness is right ulnar nerve distribution but pain is medial upper arm above the elbow mid humerus and anteromedial mid humeral. She reports recent right medial wrist, hand and distal forearm swelling and erythrema that eventually resolved But with burning dysesthesias into the right forearm. She had seen a primary care MD for mold exposure Dr. 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 She was seen initially with right neck, shoulder and arm pain with  spurling sign and  weakness C6 and C7 and underwent right C6 and C7 scoville foramenotomies. She had severe right hand pain post operatively and plain radiographs showed a right volar wrist  Area of calcification than MRI determined to be    Review of Systems  Constitutional: Negative.   HENT: Negative.   Eyes: Negative.   Respiratory: Negative.   Cardiovascular: Negative.   Gastrointestinal: Negative.   Endocrine: Positive for cold intolerance. Negative for heat intolerance, polydipsia and polyphagia.  Genitourinary:  Negative.   Musculoskeletal: Positive for arthralgias, joint swelling, neck pain and neck stiffness. Negative for back pain, gait problem and myalgias.  Skin: Negative for color change, rash and wound.  Psychiatric/Behavioral: Negative for agitation, behavioral problems, confusion, decreased concentration, dysphoric mood, hallucinations, self-injury, sleep disturbance and suicidal ideas. The patient is not nervous/anxious and is not hyperactive.      Objective: Vital Signs: BP 134/86 (BP Location: Left Arm, Patient Position: Sitting)    Pulse 86    Ht _0  (1.651 m)    Wt 142 lb (64.4 kg)    BMI 23.63 kg/m   Physical Exam  Right Hand Exam   Tenderness  The patient is experiencing tenderness in the palmar area and ulnar area.  Range of Motion  Wrist  Extension:  45 abnormal  Flexion: 30  Pronation:  90 normal  Supination:  90 normal   Muscle Strength  Wrist extension: 4/5  Wrist flexion: 4/5  Grip: 4/5   Tests  Phalens Sign: negative Tinel's sign (median nerve): positive  Other  Erythema: absent Scars: present Sensation: decreased Pulse: present  Comments:  Addison test right shoulder abducted negative.    Right Shoulder Exam   Tenderness  The patient is experiencing tenderness in the acromion and biceps tendon.  Range of Motion  Active abduction:  140 abnormal  Passive abduction:  160 abnormal  Extension: 30  External rotation: 60  Forward flexion: 160   Muscle Strength  Abduction: 4/5  Internal rotation: 4/5  External rotation: 4/5  Supraspinatus: 4/5  Subscapularis: 4/5  Biceps: 4/5   Tests  Apprehension: positive Impingement: positive  Other  Erythema: absent Scars: absent Sensation: decreased Pulse: present  Comments:  Addison test is normal with full right radial pulse with right arm abducted and externally rotated and rotation of neck right and left.       Specialty Comments:  No specialty comments available.  Imaging: No  results found.   PMFS History: Patient Active Problem List   Diagnosis Date Noted   Other spondylosis with radiculopathy, cervical region 03/26/2017    Priority: High    Class: Temporary   S/P cervical spinal fusion 06/20/2018   Status post laminectomy 03/26/2017   Past Medical History:  Diagnosis Date   Arthritis    neck   Asthma    Complication of anesthesia    pt told she should always have a "pediatric intubation tube"   Contact with and (suspected) exposure to mold (toxic)    Endometriosis    High cholesterol    Pneumonia    PONV (postoperative nausea and vomiting)     Family History  Problem Relation Age of Onset   Heart attack Mother    Arthritis Mother    High Cholesterol Mother    High Cholesterol Father    Hypertension Father    Heart attack Father     Past Surgical History:  Procedure Laterality Date   ABDOMINAL HYSTERECTOMY  2000   ANTERIOR CERVICAL DECOMP/DISCECTOMY FUSION N/A 06/20/2018  Procedure: ANTERIOR CERVICAL DISCECTOMY AND FUSION C4-5 AND C5-6 WITH PLATES, SCREWS, ALLOGRAFT BONE GRAFT AND LOCAL BONE GRAFT;  Surgeon: Jessy Oto, MD;  Location: [place];  Service: Orthopedics;  Laterality: N/A;   APPENDECTOMY     secondary to endometriosis   CARPAL TUNNEL RELEASE Right    COLONOSCOPY     LAPAROSCOPIC ENDOMETRIOSIS FULGURATION     pt had 7 surgeries related to endometriosis   POSTERIOR CERVICAL FUSION/FORAMINOTOMY N/A 03/26/2017   Procedure: Right C5-6 and C6-7 Foraminotomies;  Surgeon: Jessy Oto, MD;  Location: [place];  Service: Orthopedics;  Laterality: N/A;   ULNAR TUNNEL RELEASE Right 09/19/2017   Social History   Occupational History   Not on file  Tobacco Use   Smoking status: Never Smoker   Smokeless tobacco: Never Used  Substance and Sexual Activity   Alcohol use: Yes    Comment: very rarely   Drug use: No   Sexual activity: Not on file

## 2018-12-22 NOTE — Patient Instructions (Signed)
Plan: Thoracic outlet syndrome TOS usually responds to conservative management about 95% of the time. Studies to assess for TOS include noninvasive doppler arterial studies and Venous doppler test, also CT angiograms to assess anatomy of the arterial system that is in Close approximation to the nerves as they leave the neck and enter the right arm. Thoracic outlet syndrome is a diagnosis partly of exclusion but should show substantial findings on EMG testing and be demonstrably abnormal before considering surgery. Therapy is recommended and follow up EMG/NCV. Right arm doppler venous study to assess for compression due to TOS. Rule out a rheumatologic cause of right UE pain or arteritis.

## 2018-12-26 ENCOUNTER — Encounter: Payer: Self-pay | Admitting: Radiology

## 2018-12-26 ENCOUNTER — Encounter: Payer: Self-pay | Admitting: Specialist

## 2018-12-26 ENCOUNTER — Other Ambulatory Visit (INDEPENDENT_AMBULATORY_CARE_PROVIDER_SITE_OTHER): Payer: Self-pay | Admitting: Specialist

## 2018-12-26 MED ORDER — IBUPROFEN 800 MG PO TABS: 800.0000 mg | ORAL_TABLET | Freq: Three times a day (TID) | ORAL | 3 refills | Status: AC | PRN

## 2018-12-28 IMAGING — CR DG CERVICAL SPINE 2 OR 3 VIEWS
3 series · 3 of 3 positions shown · non-contrast
Comparison: None.

CLINICAL DATA: Intraoperative localization

EXAM:
CERVICAL SPINE - 2-3 VIEW

[AP (1 of 3)]
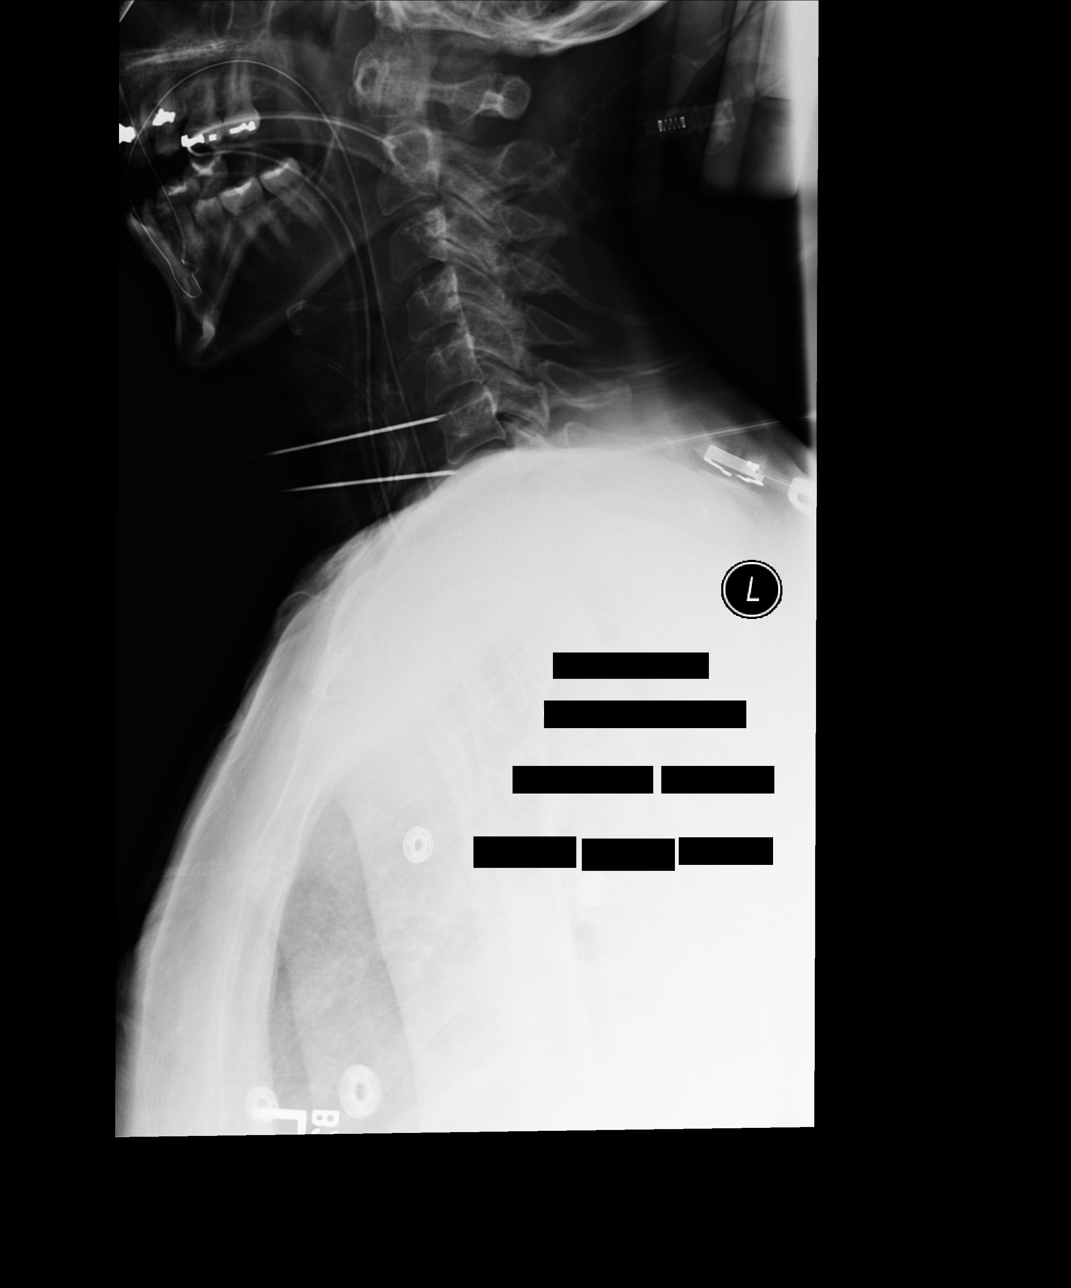

[AP (2 of 3)]
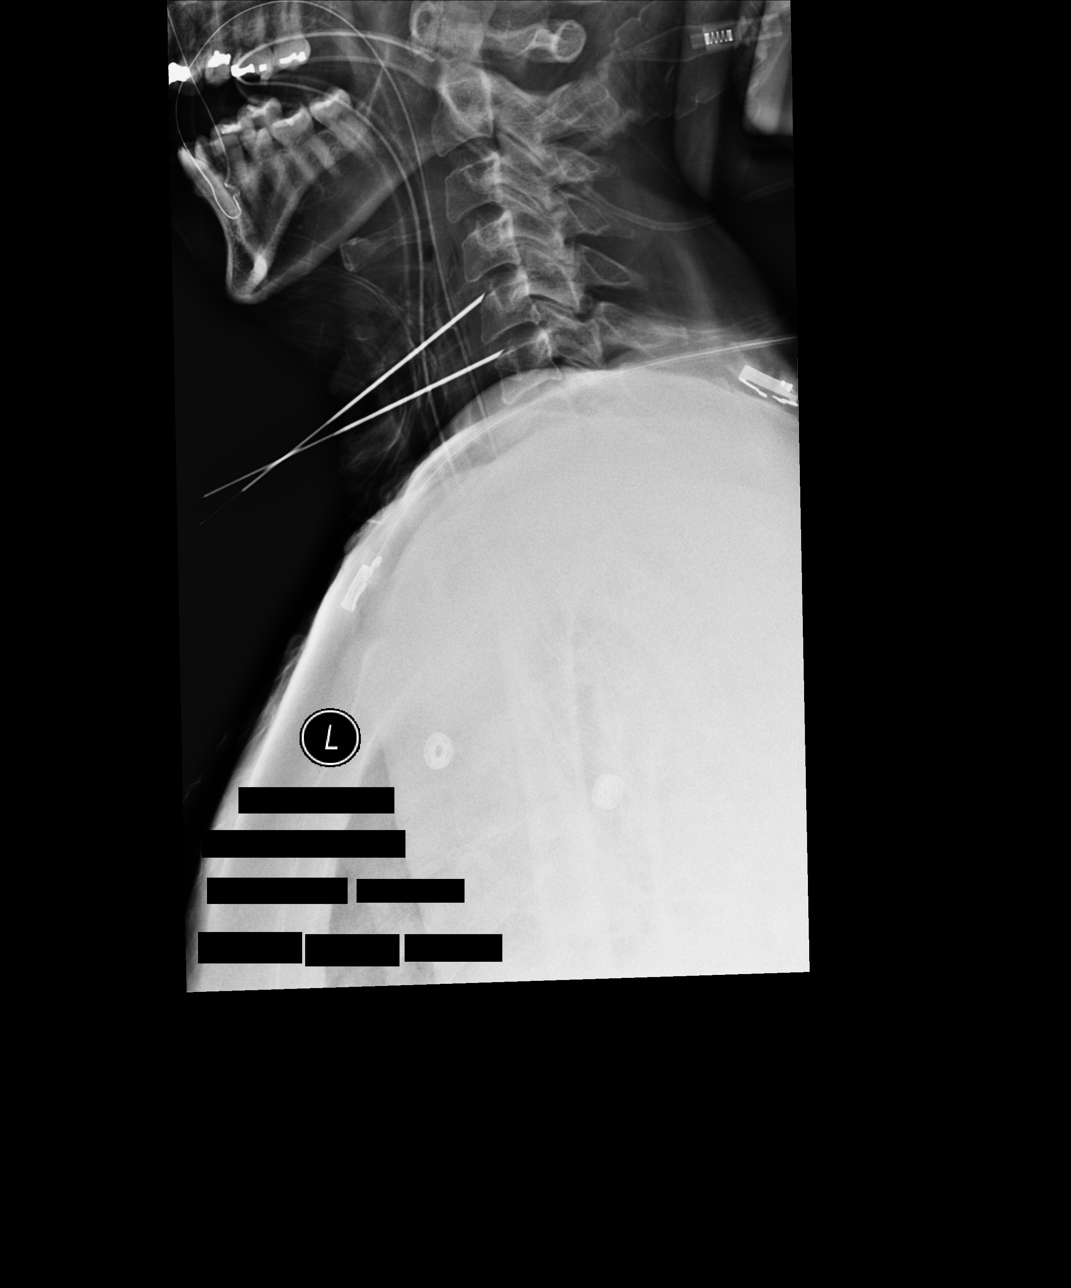

[AP (3 of 3)]
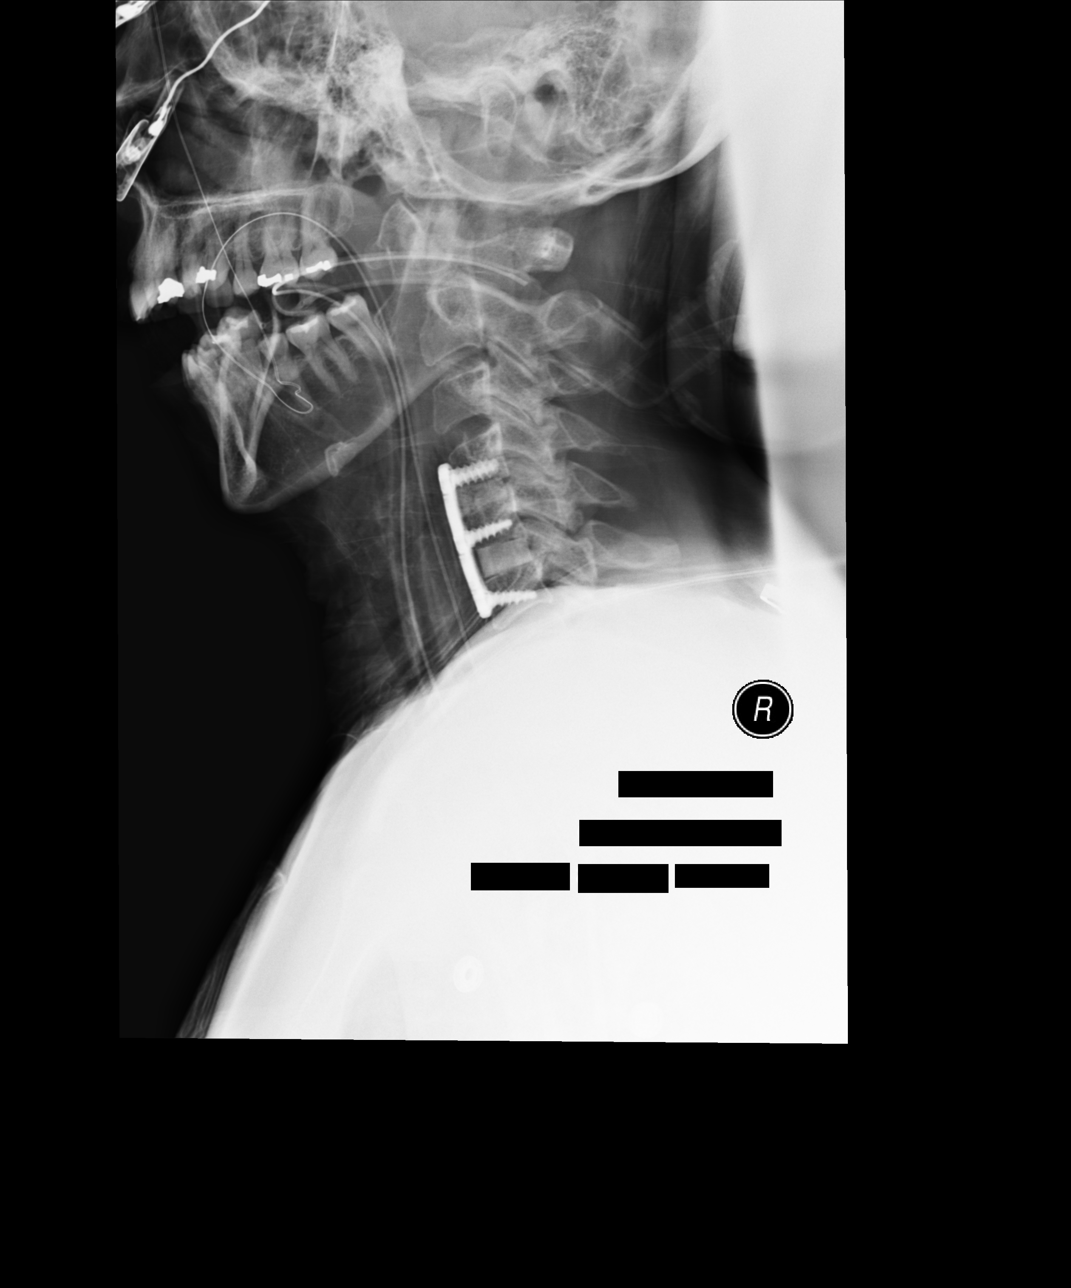

[3 of 3 positions shown; findings below may reference images not displayed]

FINDINGS: Three films were obtained intraoperatively during cervical fusion.
Initial film shows needles in the anterior aspects of the C5-6 and
C6-7 disc space. Subsequent image demonstrates needles in the
anterior aspect of the C4-5 and C5-6 disc space. Subsequent
interbody fusion at C4-5 and C5-6 with anterior fixation is noted.
IMPRESSION: Intraoperative localization for cervical fusion.

## 2018-12-30 LAB — ANA: Anti Nuclear Antibody (ANA): POSITIVE — AB

## 2018-12-30 LAB — RHEUMATOID FACTOR: Rhuematoid fact SerPl-aCnc: <14 IU/mL (ref <14)

## 2018-12-30 LAB — HIGH SENSITIVITY CRP: hs-CRP: 4.4 mg/L — ABNORMAL HIGH

## 2018-12-30 LAB — ANTI-NUCLEAR AB-TITER (ANA TITER): ANA Titer 1: 1:80 {titer} — ABNORMAL HIGH

## 2018-12-30 LAB — SEDIMENTATION RATE: Sed Rate: 11 mm/h (ref 0–30)

## 2019-01-06 ENCOUNTER — Encounter: Payer: Self-pay | Admitting: Specialist

## 2019-01-07 ENCOUNTER — Telehealth: Payer: Self-pay | Admitting: Radiology

## 2019-01-07 NOTE — Telephone Encounter (Signed)
Patient is messaging me, wanting to know what she should do with her lab results. Should she keep her PT appts that she has scheduled and should she keep her appt with Dr. Nicoletta Dress on 01/09/2019 (this Friday) based on the lab results.  Please advise.

## 2019-01-16 ENCOUNTER — Telehealth: Payer: Self-pay | Admitting: Specialist

## 2019-01-16 ENCOUNTER — Other Ambulatory Visit: Payer: Self-pay | Admitting: Radiology

## 2019-01-16 DIAGNOSIS — R899 Unspecified abnormal finding in specimens from other organs, systems and tissues: Secondary | ICD-10-CM

## 2019-01-16 NOTE — Telephone Encounter (Signed)
Patient called in to check on referral that she said Dr. Louanne Skye was to send in to Dr. Estanislado Pandy for positive ANA. We do not see a referral. Please call patient to advise.

## 2019-01-16 NOTE — Telephone Encounter (Signed)
I have put in her chart now.

## 2019-01-19 ENCOUNTER — Encounter: Payer: Self-pay | Admitting: Specialist

## 2019-01-20 ENCOUNTER — Encounter: Payer: Self-pay | Admitting: Radiology

## 2019-01-28 ENCOUNTER — Encounter: Payer: Self-pay | Admitting: Specialist

## 2019-01-28 ENCOUNTER — Ambulatory Visit (INDEPENDENT_AMBULATORY_CARE_PROVIDER_SITE_OTHER): Payer: PRIVATE HEALTH INSURANCE | Admitting: Specialist

## 2019-01-28 ENCOUNTER — Other Ambulatory Visit: Payer: Self-pay

## 2019-01-28 VITALS — BP 123/83 | HR 86 | Ht 65.0 in | Wt 142.0 lb

## 2019-01-28 DIAGNOSIS — G5621 Lesion of ulnar nerve, right upper limb: Secondary | ICD-10-CM

## 2019-01-28 DIAGNOSIS — M25521 Pain in right elbow: Secondary | ICD-10-CM | POA: Diagnosis not present

## 2019-01-28 MED ORDER — ALPRAZOLAM 0.5 MG PO TABS: ORAL_TABLET | ORAL | 0 refills | Status: DC

## 2019-01-28 MED ORDER — ALPRAZOLAM 0.5 MG PO TABS: 0.5000 mg | ORAL_TABLET | Freq: Every evening | ORAL | 0 refills | Status: DC | PRN

## 2019-01-28 NOTE — Patient Instructions (Signed)
58 year old female right handed female with persistent right arm and hand pain and numbness in the ulnar n distribution. She has had ACDF C4-5 and C5-6 and foramenotomies right C5-6 and C6-7. She has had persistent right arm difficulties with right wrist CTR and exploration of the volar capsule and repair of wrist capsule following resection of calcific with persistent right ulnar nerve symptoms she had a right ulnar nerve transposition at the right elbow.

## 2019-01-28 NOTE — Progress Notes (Addendum)
Office Visit Note   Patient: Marie Richardson           Date of Birth: Dec 25, 1960           MRN: [id number] Visit Date: 01/28/2019              Requested by: Wayland Salinas, MD [address] [place],  [place] [id number] PCP: Wayland Salinas, MD   Assessment & Plan: Visit Diagnoses:  1. Ulnar neuropathy of right upper extremity   2. Right elbow pain   58 year old  Female with persistent pain in the right ulna hand and forearm and right periscapular pain. Seen by  Dr. Nicoletta Dress at [facility] with diagnosis of TOS. No repeat studies of the right wrist or the right elbow following surgeries here. She has predominantly right hand and arm pain, an CRPS pattern post surgeries of the right wrist and right  Elbow and cervical spine. Now is considering surgery for right TOS. Clinically she is more painful and has increased nerve quality symptoms with right wrist and right elbow Tinel's. She has overlapping pain patterns. I would recommend imaging the right wrist and right elbow to assess for intrinsic ulnar nerve changes or persistent nerve compression.  Plan: Avoid overhead lifting and overhead use of the arms. Do not lift greater than 5 lbs. Adjust head rest in vehicle to prevent hyperextension if rear ended. Take extra precautions to avoid falling. MRI of the right wrist and right elbow ordered to reassess for persistent ulna n. Compression of intrinsic changes of the nerve that may cause persistence of your symptoms.   Follow-Up Instructions: Return in about 3 weeks (around 02/18/2019).   Orders:  Orders Placed This Encounter  Procedures  . MR Wrist Right w/o contrast  . MR Elbow Right w/o contrast   Meds ordered this encounter  Medications  . ALPRAZolam (XANAX) 0.5 MG tablet    Sig: Take 1 tablet (0.5 mg total) by mouth at bedtime as needed for anxiety.    Dispense:  2 tablet    Refill:  0  . DISCONTD: ALPRAZolam (XANAX) 0.5 MG tablet    Sig: Take one tablet at the  MRI and may repeat x 1.    Dispense:  2 tablet    Refill:  0  . ALPRAZolam (XANAX) 0.5 MG tablet    Sig: Take one tablet at the MRI and may repeat x 1.    Dispense:  2 tablet    Refill:  0      Procedures: No procedures performed   Clinical Data: No additional findings.   Subjective: Chief Complaint  Patient presents with  . Right Arm - Follow-up    58 year old female right handed female with persistent right arm and hand pain and numbness in the ulnar n distribution. She has had ACDF C4-5 and C5-6 and foramenotomies right C5-6 and C6-7. She has had persistent right arm difficulties with right wrist CTR and exploration of the volar capsule and repair of wrist capsule following resection of calcific with persistent right ulnar nerve symptoms she had a right ulnar nerve transposition at the right elbow.   Review of Systems  Constitutional: Negative.   HENT: Negative.   Eyes: Negative.   Respiratory: Negative.   Cardiovascular: Negative.   Gastrointestinal: Negative.   Endocrine: Negative.   Genitourinary: Negative.   Musculoskeletal: Negative.   Skin: Negative.   Allergic/Immunologic: Negative.   Neurological: Negative.   Hematological: Negative.   Psychiatric/Behavioral:  Negative.      Objective: Vital Signs: BP 123/83 (BP Location: Left Arm, Patient Position: Sitting)   Pulse 86   Ht 5\' 5"  (1.651 m)   Wt 142 lb (64.4 kg)   BMI 23.63 kg/m   Physical Exam Constitutional:      Appearance: She is well-developed.  HENT:     Head: Normocephalic and atraumatic.  Eyes:     Pupils: Pupils are equal, round, and reactive to light.  Neck:     Musculoskeletal: Normal range of motion and neck supple.  Pulmonary:     Effort: Pulmonary effort is normal.     Breath sounds: Normal breath sounds.  Abdominal:     General: Bowel sounds are normal.     Palpations: Abdomen is soft.  Skin:    General: Skin is warm and dry.  Neurological:     Mental Status: She is alert  and oriented to person, place, and time.  Psychiatric:        Behavior: Behavior normal.        Thought Content: Thought content normal.        Judgment: Judgment normal.     Right Hand Exam   Tenderness  The patient is experiencing tenderness in the palmar area and ulnar area.  Range of Motion  Wrist  Extension:  40 abnormal  Flexion:  40 abnormal  Pronation: normal  Supination: normal   Muscle Strength  Wrist extension: 4/5  Wrist flexion: 4/5  Grip: 4/5   Tests  Phalen's sign: positive Tinel's sign (median nerve): positive  Other  Erythema: absent Scars: present Sensation: decreased Pulse: present  Comments:  Right ulnar two digits with numbness and postive tinel's right Guyon's canal into the ulnar two digits.   Right Elbow Exam   Tenderness  The patient is experiencing tenderness in the medial epicondyle.   Range of Motion  Extension: normal  Flexion: normal  Pronation: normal  Supination: normal   Muscle Strength  Pronation:  4/5  Supination:  4/5   Tests  Varus: negative Valgus: negative Tinel's sign (cubital tunnel): positive  Other  Erythema: absent Sensation: decreased Pulse: present  Comments:  Right ulna nerve transposed anteriorly, now with persistent ulna nerve symptoms.       Specialty Comments:  No specialty comments available.  Imaging: No results found.   PMFS History: Patient Active Problem List   Diagnosis Date Noted  . Other spondylosis with radiculopathy, cervical region 03/26/2017    Priority: High    Class: Temporary  . S/P cervical spinal fusion 06/20/2018  . Status post laminectomy 03/26/2017   Past Medical History:  Diagnosis Date  . Arthritis    neck  . Asthma   . Complication of anesthesia    pt told she should always have a "pediatric intubation tube"  . 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 Endometriosis   . High cholesterol   . Pneumonia   . PONV (postoperative nausea  and vomiting)     Family History  Problem Relation Age of Onset  . Heart attack Mother   . Arthritis Mother   . High Cholesterol Mother   . High Cholesterol Father   . Hypertension Father   . Heart attack Father     Past Surgical History:  Procedure Laterality Date  . ABDOMINAL HYSTERECTOMY  2000  . ANTERIOR CERVICAL DECOMP/DISCECTOMY FUSION N/A 06/20/2018   Procedure: ANTERIOR CERVICAL DISCECTOMY AND FUSION C4-5 AND C5-6 WITH PLATES, SCREWS, ALLOGRAFT  BONE GRAFT AND LOCAL BONE GRAFT;  Surgeon: Jessy Oto, MD;  Location: [place];  Service: Orthopedics;  Laterality: N/A;  . APPENDECTOMY     secondary to endometriosis  . CARPAL TUNNEL RELEASE Right   . COLONOSCOPY    . LAPAROSCOPIC ENDOMETRIOSIS FULGURATION     pt had 7 surgeries related to endometriosis  . POSTERIOR CERVICAL FUSION/FORAMINOTOMY N/A 03/26/2017   Procedure: Right C5-6 and C6-7 Foraminotomies;  Surgeon: Jessy Oto, MD;  Location: [place];  Service: Orthopedics;  Laterality: N/A;  . ULNAR TUNNEL RELEASE Right 09/19/2017   Social History   Occupational History  . Not on file  Tobacco Use  . Smoking status: Never Smoker  . Smokeless tobacco: Never Used  Substance and Sexual Activity  . Alcohol use: Yes    Comment: very rarely  . Drug use: No  . Sexual activity: Not on file

## 2019-01-29 ENCOUNTER — Ambulatory Visit: Payer: Self-pay | Admitting: Specialist

## 2019-02-02 ENCOUNTER — Telehealth: Payer: Self-pay | Admitting: Radiology

## 2019-02-02 ENCOUNTER — Encounter: Payer: Self-pay | Admitting: Specialist

## 2019-02-02 NOTE — Telephone Encounter (Signed)
Patient is calling states that Dr. Estanislado Pandy is going to order an MRI of her brain, and Dr. Louanne Skye has ordered and MRI or her right wrist and elbow.  She wants to know if we can also order and MRI of her right shoulder also.  Please advise

## 2019-02-03 ENCOUNTER — Encounter: Payer: Self-pay | Admitting: *Deleted

## 2019-02-03 ENCOUNTER — Encounter: Payer: Self-pay | Admitting: Radiology

## 2019-02-03 NOTE — Telephone Encounter (Signed)
No.  I do not see where it is clinically indicated at this point.  Follow-up with Dr. Louanne Skye to review imaging studies of the wrist and elbow and Dr. Estanislado Pandy for the brain

## 2019-02-03 NOTE — Telephone Encounter (Signed)
Sent message in my chart. 

## 2019-02-05 ENCOUNTER — Other Ambulatory Visit: Payer: Self-pay | Admitting: Specialist

## 2019-02-05 DIAGNOSIS — G5621 Lesion of ulnar nerve, right upper limb: Secondary | ICD-10-CM

## 2019-02-05 DIAGNOSIS — M25521 Pain in right elbow: Secondary | ICD-10-CM

## 2019-02-11 NOTE — Progress Notes (Signed)
Office Visit Note  Patient: Marie Richardson             Date of Birth: 1961-02-27           MRN: [id number]             PCP: Wayland Salinas, MD Referring: Jessy Oto, MD Visit Date: 02/23/2019 Occupation: @GUAROCC @  Subjective:  Positive ANA   History of Present Illness: Bennetta Rudden is a 58 y.o. female seen in consultation per request of Dr. Louanne Skye for evaluation of positive ANA.  According to patient she has had history of cervical spine pain for few years now.  She states 3 years ago she was seen by Dr. Louanne Skye and at the time she was having right-sided radiculopathy.  A year later she had right ulnar nerve release without much help to her neck symptoms.  She underwent C-spine fusion by Dr. Louanne Skye.  She was also referred to Dr. Truman Hayward at [facility] who diagnosed her with thoracic outlet syndrome and advised surgery.  She states she still continues to have pain in her right shoulder neck right arm and has difficulty raising her arm.  She states Dr. Louanne Skye recently injected her right shoulder with cortisone which gave her some relief.  She also had some surgery on her right hand and is wearing a brace throughout currently.  None of the other joints are painful.  She denies any history of joint swelling.  There is no history of oral ulcers, nasal ulcers, malar rash, photosensitivity, Raynaud's phenomenon, lymphadenopathy.  There is no family history of autoimmune disease.  Activities of Daily Living:  Patient reports morning stiffness for 1 hour.   Patient Reports nocturnal pain.  Difficulty dressing/grooming: Reports Difficulty climbing stairs: Reports Difficulty getting out of chair: Denies Difficulty using hands for taps, buttons, cutlery, and/or writing: Reports  Review of Systems  Constitutional: Positive for fatigue. Negative for night sweats, weight gain and weight loss.  HENT: Positive for mouth dryness. Negative for mouth sores, trouble swallowing, trouble  swallowing and nose dryness.   Eyes: Positive for dryness. Negative for pain, redness and visual disturbance.  Respiratory: Negative for cough, shortness of breath and difficulty breathing.   Cardiovascular: Negative for chest pain, palpitations, hypertension, irregular heartbeat and swelling in legs/feet.  Gastrointestinal: Negative for blood in stool, constipation and diarrhea.  Endocrine: Negative for cold intolerance and increased urination.  Genitourinary: Negative for difficulty urinating and vaginal dryness.  Musculoskeletal: Positive for arthralgias, joint pain, muscle weakness, morning stiffness and muscle tenderness. Negative for gait problem, joint swelling, myalgias and myalgias.  Skin: Negative for color change, rash, hair loss, skin tightness, ulcers and sensitivity to sunlight.  Allergic/Immunologic: Negative for susceptible to infections.  Neurological: Positive for numbness. Negative for dizziness, memory loss, night sweats and weakness.  Hematological: Negative for bruising/bleeding tendency and swollen glands.  Psychiatric/Behavioral: Positive for sleep disturbance. Negative for depressed mood. The patient is not nervous/anxious.     PMFS History:  Patient Active Problem List   Diagnosis Date Noted  . S/P cervical spinal fusion 06/20/2018  . Status post laminectomy 03/26/2017  . Other spondylosis with radiculopathy, cervical region 03/26/2017    Class: Temporary    Past Medical History:  Diagnosis Date  . Arthritis    neck  . Asthma   . Complication of anesthesia    pt told she should always have a "pediatric intubation tube"  . 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 Endometriosis   .  High cholesterol   . Pneumonia   . PONV (postoperative nausea and vomiting)     Family History  Problem Relation Age of Onset  . Heart attack Mother   . Arthritis Mother   . High Cholesterol Mother   . High Cholesterol Father   . Hypertension Father   .  Heart attack Father    Past Surgical History:  Procedure Laterality Date  . ABDOMINAL HYSTERECTOMY  2000  . ANTERIOR CERVICAL DECOMP/DISCECTOMY FUSION N/A 06/20/2018   Procedure: ANTERIOR CERVICAL DISCECTOMY AND FUSION C4-5 AND C5-6 WITH PLATES, SCREWS, ALLOGRAFT BONE GRAFT AND LOCAL BONE GRAFT;  Surgeon: Jessy Oto, MD;  Location: [place];  Service: Orthopedics;  Laterality: N/A;  . APPENDECTOMY     secondary to endometriosis  . CARPAL TUNNEL RELEASE Right   . COLONOSCOPY    . LAPAROSCOPIC ENDOMETRIOSIS FULGURATION     pt had 7 surgeries related to endometriosis  . POSTERIOR CERVICAL FUSION/FORAMINOTOMY N/A 03/26/2017   Procedure: Right C5-6 and C6-7 Foraminotomies;  Surgeon: Jessy Oto, MD;  Location: [place];  Service: Orthopedics;  Laterality: N/A;  . ULNAR TUNNEL RELEASE Right 09/19/2017   Social History   Social History Narrative  . Not on file    There is no immunization history on file for this patient.   Objective: Vital Signs: BP 120/67 (BP Location: Left Arm, Patient Position: Sitting, Cuff Size: Normal)   Pulse 87   Resp 14   Ht 5' 5.38" (1.661 m)   Wt 144 lb (65.3 kg)   BMI 23.69 kg/m    Physical Exam Vitals signs and nursing note reviewed.  Constitutional:      Appearance: She is well-developed.  HENT:     Head: Normocephalic and atraumatic.  Eyes:     Conjunctiva/sclera: Conjunctivae normal.  Neck:     Musculoskeletal: Normal range of motion.  Cardiovascular:     Rate and Rhythm: Normal rate and regular rhythm.     Heart sounds: Normal heart sounds.  Pulmonary:     Effort: Pulmonary effort is normal.     Breath sounds: Normal breath sounds.  Abdominal:     General: Bowel sounds are normal.     Palpations: Abdomen is soft.  Lymphadenopathy:     Cervical: No cervical adenopathy.  Skin:    General: Skin is warm and dry.     Capillary Refill: Capillary refill takes less than 2 seconds.  Neurological:     Mental Status: She is alert and oriented  to person, place, and time.  Psychiatric:        Behavior: Behavior normal.      Musculoskeletal Exam: Patient has limited right lateral rotation C-spine.  Right shoulder joint abduction was limited to about 110 degrees.  She has discomfort with internal and external rotation.  Her right wrist was in a brace due to recent surgery.  Left shoulder joint, elbow joint, wrist joint, MCPs, PIPs and DIPs with good range of motion with no synovitis.  She has good range of motion of bilateral hip joints knee joints ankles MTPs PIPs without any swelling or synovitis.  CDAI Exam: CDAI Score: - Patient Global: -; Provider Global: - Swollen: -; Tender: - Joint Exam   No joint exam has been documented for this visit   There is currently no information documented on the homunculus. Go to the Rheumatology activity and complete the homunculus joint exam.  Investigation: Findings:  12/26/18: ANA 1:80 Cytoplasmic, sed rate 11, CRP 4.4, RF<14  Component     Latest Ref Rng & Units 12/26/2018  ANA Titer 1     titer 1:80 (H)  ANA Pattern 1      Cytoplasmic (A)  Sed Rate     0 - 30 mm/h 11  hs-CRP     mg/L 4.4 (H)  Anti Nuclear Antibody (ANA)     NEGATIVE POSITIVE (A)  RA Latex Turbid.     <14 IU/mL <14   Imaging: Mr Wrist Right W/o Contrast  Result Date: 02/16/2019 CLINICAL DATA:  L1 wrist pain for 3 years. EXAM: MR OF THE RIGHT WRIST WITHOUT CONTRAST TECHNIQUE: Multiplanar, multisequence MR imaging of the right wrist was performed. No intravenous contrast was administered. COMPARISON:  None. FINDINGS: Ligaments: Intact. Triangular fibrocartilage: Intact. Tendons: Flexor and extensor compartment tendons are intact. Carpal tunnel/median nerve: Normal carpal tunnel. Normal median nerve. Guyon's canal: Normal. Visualized portion of the ulnar nerve demonstrates no signal abnormality. Postsurgical changes along the volar ulnar aspect of the wrist. Joint/cartilage: No joint effusion. Mild partial-thickness  cartilage loss of the first [facility] joint with tiny marginal osteophytes. Bones/carpal alignment: No acute osseous abnormality. No aggressive osseous lesion. Other: No fluid collection or hematoma. IMPRESSION: 1. No acute injury of the wrist. 2. Guyon's canal is normal. Electronically Signed   By: Kathreen Devoid   On: 02/16/2019 08:51   Mr Elbow Right W/o Contrast  Result Date: 02/16/2019 CLINICAL DATA:  Right elbow and wrist pain for 3 years. History of carpal tunnel surgery. EXAM: MRI OF THE RIGHT ELBOW WITHOUT CONTRAST TECHNIQUE: Multiplanar, multisequence MR imaging of the elbow was performed. No intravenous contrast was administered. COMPARISON:  None. FINDINGS: TENDONS Common forearm flexor origin: Intact. Common forearm extensor origin: Intact. Biceps: Intact. Triceps: Intact. LIGAMENTS Medial stabilizers: Intact. Lateral stabilizers:  Intact. Cartilage: No chondral defect. Joint: No joint effusion. Cubital tunnel: Mild scarring in the cubital tunnel from prior surgery. Ulnar nerve is normal in size and signal and peripherally located along the posterior periphery of the medial epicondyle. Bones: No acute osseous abnormality.  No aggressive osseous lesion. Soft tissue: Muscles are normal.  No muscle edema or muscle atrophy. IMPRESSION: 1. Mild scarring in the cubital tunnel from prior surgery. Ulnar nerve is normal in size and signal and peripherally located along the posterior periphery of the medial epicondyle. Electronically Signed   By: Kathreen Devoid   On: 02/16/2019 08:46    Recent Labs: Lab Results  Component Value Date   WBC 14.6 (H) 06/21/2018   HGB 12.9 06/21/2018   PLT 347 06/21/2018   NA 139 06/17/2018   K 3.7 06/17/2018   CL 106 06/17/2018   CO2 29 06/17/2018   GLUCOSE 88 06/17/2018   BUN 7 06/17/2018   CREATININE 0.61 06/17/2018   BILITOT 0.5 06/17/2018   ALKPHOS 53 06/17/2018   AST 22 06/17/2018   ALT 19 06/17/2018   PROT 7.6 06/17/2018   ALBUMIN 4.6 06/17/2018   CALCIUM 9.7  06/17/2018   GFRAA >60 06/17/2018    Speciality Comments: No specialty comments available.  Procedures:  No procedures performed Allergies: Other and Shellfish allergy   Assessment / Plan:     Visit Diagnoses: Positive ANA (antinuclear antibody) - 12/26/18: ANA 1:80 Cytoplasmic, sed rate 11, CRP 4.4, RF<14 -patient has no clinical features of autoimmune disease on examination.  To complete the work-up I will obtain following labs today.  Plan: Anti-scleroderma antibody, RNP Antibody, Anti-Smith antibody, Sjogrens syndrome-A extractable nuclear antibody, Sjogrens syndrome-B extractable nuclear antibody, Anti-DNA  antibody, double-stranded, C3 and C4,  Elevated C-reactive protein (CRP) -her sed rate is normal.  CRP could be elevated due to recent surgery.  She had no synovitis on examination.  Other spondylosis with radiculopathy, cervical region -patient has chronic discomfort in her C-spine.  She has had fusion and laminectomy by Dr. Louanne Skye.  S/P cervical spinal fusion - And laminectomy   Other fatigue - Plan: CK, CBC with Differential/Platelet, COMPLETE METABOLIC PANEL WITH GFR,   Primary insomnia -patient is on Ambien.  Essential hypertension -treated with lisinopril.  Dyslipidemia -she is on Repatha injections.  Orders: Orders Placed This Encounter  Procedures  . Anti-scleroderma antibody  . RNP Antibody  . Anti-Smith antibody  . Sjogrens syndrome-A extractable nuclear antibody  . Sjogrens syndrome-B extractable nuclear antibody  . Anti-DNA antibody, double-stranded  . C3 and C4  . CK  . CBC with Differential/Platelet  . COMPLETE METABOLIC PANEL WITH GFR   No orders of the defined types were placed in this encounter.     Follow-Up Instructions: Return if symptoms worsen or fail to improve, for Positive ANA.   Bo Merino, MD  Note - This record has been created using Editor, commissioning.  Chart creation errors have been sought, but may not always  have been  located. Such creation errors do not reflect on  the standard of medical care.

## 2019-02-15 ENCOUNTER — Ambulatory Visit (INDEPENDENT_AMBULATORY_CARE_PROVIDER_SITE_OTHER): Payer: No Typology Code available for payment source

## 2019-02-15 ENCOUNTER — Other Ambulatory Visit: Payer: Self-pay

## 2019-02-15 DIAGNOSIS — M25521 Pain in right elbow: Secondary | ICD-10-CM

## 2019-02-15 DIAGNOSIS — M25531 Pain in right wrist: Secondary | ICD-10-CM

## 2019-02-15 DIAGNOSIS — G5621 Lesion of ulnar nerve, right upper limb: Secondary | ICD-10-CM

## 2019-02-15 DIAGNOSIS — G8929 Other chronic pain: Secondary | ICD-10-CM

## 2019-02-18 ENCOUNTER — Ambulatory Visit: Payer: Self-pay

## 2019-02-18 ENCOUNTER — Other Ambulatory Visit: Payer: Self-pay

## 2019-02-18 ENCOUNTER — Telehealth: Payer: Self-pay | Admitting: Specialist

## 2019-02-18 ENCOUNTER — Encounter: Payer: Self-pay | Admitting: Specialist

## 2019-02-18 ENCOUNTER — Ambulatory Visit (INDEPENDENT_AMBULATORY_CARE_PROVIDER_SITE_OTHER): Payer: No Typology Code available for payment source | Admitting: Specialist

## 2019-02-18 VITALS — BP 121/79 | HR 80 | Ht 65.0 in | Wt 142.0 lb

## 2019-02-18 DIAGNOSIS — G5621 Lesion of ulnar nerve, right upper limb: Secondary | ICD-10-CM | POA: Diagnosis not present

## 2019-02-18 DIAGNOSIS — M5412 Radiculopathy, cervical region: Secondary | ICD-10-CM

## 2019-02-18 DIAGNOSIS — G8929 Other chronic pain: Secondary | ICD-10-CM

## 2019-02-18 DIAGNOSIS — M25511 Pain in right shoulder: Secondary | ICD-10-CM | POA: Diagnosis not present

## 2019-02-18 MED ORDER — METHOCARBAMOL 500 MG PO TABS: 500.0000 mg | ORAL_TABLET | Freq: Three times a day (TID) | ORAL | 2 refills | Status: DC | PRN

## 2019-02-18 MED ORDER — GABAPENTIN 300 MG PO CAPS: ORAL_CAPSULE | ORAL | 3 refills | Status: DC

## 2019-02-18 NOTE — Telephone Encounter (Signed)
She is to see Dr. Junius Roads next week then be scheduled with Dr. Louanne Skye in 3-4 weeks.

## 2019-02-18 NOTE — Patient Instructions (Addendum)
Avoid overhead lifting and overhead use of the arms. Do not lift greater than 5 lbs. Adjust head rest in vehicle to prevent hyperextension if rear ended. Take extra precautions to avoid falling. The right shoulder is painful and injection today may allow [place] to determine if the shoulder is painful due to bursitis or tendonitis intrinsic to the right shoulder.Right arm is persistently painful. MRI with deviation of the right ulna artery at the level of the right distal ulna and then returns to the little finger side of the wrist and hand. MRI of the right elbow suggests the right ulna nerve that was moved at the time of surgery may be posterior to the right medial epicondyle and may be in a vulnerable area that may cause persistent symptoms due to intermittant compression of pressure in normal activities.  Ice for the area of injection 30 min on and 1 hour off as needed for local pain. Return in one week to see Dr. Junius Roads ( sports medicine FP) for ultrasound of the right elbow ulna nerve and block of the right ulna nerve above the elbow and above the area of previous transposition.  May increase hs dose of gabapentin to  600 mg at night with evening meal, 300 mg with lunch and 300 mg in the AM.

## 2019-02-18 NOTE — Progress Notes (Signed)
Office Visit Note   Patient: Marie Richardson           Date of Birth: 27-Oct-1960           MRN: [id number] Visit Date: 02/18/2019              Requested by: Wayland Salinas, MD [address] [place],  [place] [address] PCP: Wayland Salinas, MD   Assessment & Plan: Visit Diagnoses:  1. Acute pain of right shoulder   2. Chronic right shoulder pain     Plan: Avoid overhead lifting and overhead use of the arms. Do not lift greater than 5 lbs. Adjust head rest in vehicle to prevent hyperextension if rear ended. Take extra precautions to avoid falling. The right shoulder is painful and injection today may allow [place] to determine if the shoulder is painful due to bursitis or tendonitis intrinsic to the right shoulder.Right arm is persistently painful. MRI with deviation of the right ulna artery at the level of the right distal ulna and then returns to the little finger side of the wrist and hand. MRI of the right elbow suggests the right ulna nerve that was moved at the time of surgery may be posterior to the right medial epicondyle and may be in a vulnerable area that may cause persistent symptoms due to intermittant compression of pressure in normal activities.  Ice for the area of injection 30 min on and 1 hour off as needed for local pain. Return in one week to see Dr. Junius Roads ( sports medicine FP) for ultrasound of the right elbow ulna nerve and block of the right ulna nerve above the elbow and above the area of previous transposition.  May increase hs dose of gabapentin to  600 mg at night with evening meal, 300 mg with lunch and 300 mg in the AM.  Follow-Up Instructions: No follow-ups on file.   Orders:  Orders Placed This Encounter  Procedures  . XR Shoulder Right   No orders of the defined types were placed in this encounter.     Procedures: No procedures performed   Clinical Data: No additional findings.   Subjective: Chief Complaint   Patient presents with  . Right Elbow - Follow-up    MRI Review  . Right Wrist - Follow-up    MRI Review    HPI  Review of Systems   Objective: Vital Signs: BP 121/79 (BP Location: Left Arm, Patient Position: Sitting)   Pulse 80   Ht 5\' 5"  (1.651 m)   Wt 142 lb (64.4 kg)   BMI 23.63 kg/m   Physical Exam  Ortho Exam  Specialty Comments:  No specialty comments available.  Imaging: No results found.   PMFS History: Patient Active Problem List   Diagnosis Date Noted  . Other spondylosis with radiculopathy, cervical region 03/26/2017    Priority: High    Class: Temporary  . S/P cervical spinal fusion 06/20/2018  . Status post laminectomy 03/26/2017   Past Medical History:  Diagnosis Date  . Arthritis    neck  . Asthma   . Complication of anesthesia    pt told she should always have a "pediatric intubation tube"  . 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 Endometriosis   . High cholesterol   . Pneumonia   . PONV (postoperative nausea and vomiting)     Family History  Problem Relation Age of Onset  . Heart attack Mother   .  Arthritis Mother   . High Cholesterol Mother   . High Cholesterol Father   . Hypertension Father   . Heart attack Father     Past Surgical History:  Procedure Laterality Date  . ABDOMINAL HYSTERECTOMY  2000  . ANTERIOR CERVICAL DECOMP/DISCECTOMY FUSION N/A 06/20/2018   Procedure: ANTERIOR CERVICAL DISCECTOMY AND FUSION C4-5 AND C5-6 WITH PLATES, SCREWS, ALLOGRAFT BONE GRAFT AND LOCAL BONE GRAFT;  Surgeon: Jessy Oto, MD;  Location: [place];  Service: Orthopedics;  Laterality: N/A;  . APPENDECTOMY     secondary to endometriosis  . CARPAL TUNNEL RELEASE Right   . COLONOSCOPY    . LAPAROSCOPIC ENDOMETRIOSIS FULGURATION     pt had 7 surgeries related to endometriosis  . POSTERIOR CERVICAL FUSION/FORAMINOTOMY N/A 03/26/2017   Procedure: Right C5-6 and C6-7 Foraminotomies;  Surgeon: Jessy Oto, MD;  Location: [place];  Service: Orthopedics;  Laterality: N/A;  . ULNAR TUNNEL RELEASE Right 09/19/2017   Social History   Occupational History  . Not on file  Tobacco Use  . Smoking status: Never Smoker  . Smokeless tobacco: Never Used  Substance and Sexual Activity  . Alcohol use: Yes    Comment: very rarely  . Drug use: No  . Sexual activity: Not on file

## 2019-02-18 NOTE — Telephone Encounter (Signed)
Patient just left and confused with appt. Gave appt w/[facility] on Monday. Per Dr Louanne Skye he wants her back in 3 weeks. Patient stated that is not what she was told.

## 2019-02-20 ENCOUNTER — Other Ambulatory Visit: Payer: PRIVATE HEALTH INSURANCE

## 2019-02-23 ENCOUNTER — Encounter: Payer: Self-pay | Admitting: Family Medicine

## 2019-02-23 ENCOUNTER — Ambulatory Visit (INDEPENDENT_AMBULATORY_CARE_PROVIDER_SITE_OTHER): Payer: No Typology Code available for payment source | Admitting: Rheumatology

## 2019-02-23 ENCOUNTER — Encounter: Payer: Self-pay | Admitting: Rheumatology

## 2019-02-23 ENCOUNTER — Other Ambulatory Visit: Payer: Self-pay

## 2019-02-23 ENCOUNTER — Telehealth: Payer: Self-pay

## 2019-02-23 ENCOUNTER — Ambulatory Visit (INDEPENDENT_AMBULATORY_CARE_PROVIDER_SITE_OTHER): Payer: No Typology Code available for payment source | Admitting: Family Medicine

## 2019-02-23 VITALS — BP 120/67 | HR 87 | Resp 14 | Ht 65.38 in | Wt 144.0 lb

## 2019-02-23 DIAGNOSIS — E785 Hyperlipidemia, unspecified: Secondary | ICD-10-CM

## 2019-02-23 DIAGNOSIS — M25521 Pain in right elbow: Secondary | ICD-10-CM

## 2019-02-23 DIAGNOSIS — R768 Other specified abnormal immunological findings in serum: Secondary | ICD-10-CM | POA: Diagnosis not present

## 2019-02-23 DIAGNOSIS — R5383 Other fatigue: Secondary | ICD-10-CM

## 2019-02-23 DIAGNOSIS — M79601 Pain in right arm: Secondary | ICD-10-CM

## 2019-02-23 DIAGNOSIS — Z981 Arthrodesis status: Secondary | ICD-10-CM

## 2019-02-23 DIAGNOSIS — R7982 Elevated C-reactive protein (CRP): Secondary | ICD-10-CM

## 2019-02-23 DIAGNOSIS — I10 Essential (primary) hypertension: Secondary | ICD-10-CM

## 2019-02-23 DIAGNOSIS — M4722 Other spondylosis with radiculopathy, cervical region: Secondary | ICD-10-CM | POA: Diagnosis not present

## 2019-02-23 DIAGNOSIS — F5101 Primary insomnia: Secondary | ICD-10-CM

## 2019-02-23 NOTE — Telephone Encounter (Signed)
Patient was seen as a new patient today, 02/23/2019. Per Dr. Estanislado Pandy, if labs are normal, patient can be called with lab results.

## 2019-02-23 NOTE — Progress Notes (Signed)
Subjective: She is here at the request of Dr. Louanne Skye for ultrasound-guided ulnar nerve block proximal to the elbow.  Longstanding problems with her arm, she describes a burning pain from the medial aspect of her upper arm down to the fourth and fifth fingers and into the trapezius and shoulder blade areas.  Objective: She is very tender to palpation on the medial side of her upper arm in the region of the ulnar nerve.  Ultrasound right arm: The ulnar nerve was tracked from the wrist proximally to the mid humeral area.  Distally the nerve has a normal honeycomb pattern, more hyperechoic, but from the elbow proximally it is much more hypoechoic suggesting chronic irritation/entrapment.  It is no longer located in its groove but it is anterior to it.  Procedure: Ultrasound-guided right ulnar nerve block proximal to the elbow: After sterile prep with alcohol, injected 2 cc 1% lidocaine without epinephrine and 20 mg methylprednisolone using ultrasound to guide needle placement beside the ulnar nerve.  During the immediate anesthetic phase she had modest improvement in pain.  She will keep note of how she feels and follow-up with Dr. Louanne Skye in about 2 weeks.

## 2019-02-25 LAB — C3 AND C4
C3 Complement: 167 mg/dL (ref 83–193)
C4 Complement: 28 mg/dL (ref 15–57)

## 2019-02-25 LAB — CBC WITH DIFFERENTIAL/PLATELET
Absolute Monocytes: 608 cells/uL (ref 200–950)
Basophils Absolute: 96 cells/uL (ref 0–200)
Basophils Relative: 1.2 %
Eosinophils Absolute: 216 cells/uL (ref 15–500)
Eosinophils Relative: 2.7 %
HCT: 41.9 % (ref 35.0–45.0)
Hemoglobin: 14.2 g/dL (ref 11.7–15.5)
Lymphs Abs: 3464 cells/uL (ref 850–3900)
MCH: 32.7 pg (ref 27.0–33.0)
MCHC: 33.9 g/dL (ref 32.0–36.0)
MCV: 96.5 fL (ref 80.0–100.0)
MPV: 10.5 fL (ref 7.5–12.5)
Monocytes Relative: 7.6 %
Neutro Abs: 3616 cells/uL (ref 1500–7800)
Neutrophils Relative %: 45.2 %
Platelets: 365 10*3/uL (ref 140–400)
RBC: 4.34 10*6/uL (ref 3.80–5.10)
RDW: 11.8 % (ref 11.0–15.0)
Total Lymphocyte: 43.3 %
WBC: 8 10*3/uL (ref 3.8–10.8)

## 2019-02-25 LAB — RNP ANTIBODY: Ribonucleic Protein(ENA) Antibody, IgG: <1 AI

## 2019-02-25 LAB — COMPLETE METABOLIC PANEL WITH GFR
AG Ratio: 1.8 (calc) (ref 1.0–2.5)
ALT: 7 U/L (ref 6–29)
AST: 12 U/L (ref 10–35)
Albumin: 5.1 g/dL (ref 3.6–5.1)
Alkaline phosphatase (APISO): 57 U/L (ref 37–153)
BUN: 11 mg/dL (ref 7–25)
CO2: 31 mmol/L (ref 20–32)
Calcium: 10.1 mg/dL (ref 8.6–10.4)
Chloride: 102 mmol/L (ref 98–110)
Creat: 0.61 mg/dL (ref 0.50–1.05)
GFR, Est African American: 117 mL/min/{1.73_m2} (ref >=60)
GFR, Est Non African American: 101 mL/min/{1.73_m2} (ref >=60)
Globulin: 2.8 g/dL (calc) (ref 1.9–3.7)
Glucose, Bld: 101 mg/dL — ABNORMAL HIGH (ref 65–99)
Potassium: 3.9 mmol/L (ref 3.5–5.3)
Sodium: 142 mmol/L (ref 135–146)
Total Bilirubin: 0.4 mg/dL (ref 0.2–1.2)
Total Protein: 7.9 g/dL (ref 6.1–8.1)

## 2019-02-25 LAB — CK: Total CK: 43 U/L (ref 29–143)

## 2019-02-25 LAB — ANTI-SMITH ANTIBODY: ENA SM Ab Ser-aCnc: <1 AI

## 2019-02-25 LAB — SJOGRENS SYNDROME-A EXTRACTABLE NUCLEAR ANTIBODY: SSA (Ro) (ENA) Antibody, IgG: <1 AI

## 2019-02-25 LAB — ANTI-DNA ANTIBODY, DOUBLE-STRANDED: ds DNA Ab: <1 IU/mL

## 2019-02-25 LAB — SJOGRENS SYNDROME-B EXTRACTABLE NUCLEAR ANTIBODY: SSB (La) (ENA) Antibody, IgG: <1 AI

## 2019-02-25 LAB — ANTI-SCLERODERMA ANTIBODY: Scleroderma (Scl-70) (ENA) Antibody, IgG: <1 AI

## 2019-02-25 NOTE — Telephone Encounter (Signed)
Patient advised lab results are normal except mild elevation in glucose.

## 2019-03-19 ENCOUNTER — Encounter: Payer: Self-pay | Admitting: Specialist

## 2019-03-19 ENCOUNTER — Ambulatory Visit (INDEPENDENT_AMBULATORY_CARE_PROVIDER_SITE_OTHER): Payer: No Typology Code available for payment source | Admitting: Specialist

## 2019-03-19 VITALS — BP 126/81 | HR 81 | Ht 65.0 in | Wt 142.0 lb

## 2019-03-19 DIAGNOSIS — G5621 Lesion of ulnar nerve, right upper limb: Secondary | ICD-10-CM

## 2019-03-19 DIAGNOSIS — R29898 Other symptoms and signs involving the musculoskeletal system: Secondary | ICD-10-CM | POA: Diagnosis not present

## 2019-03-19 DIAGNOSIS — M6258 Muscle wasting and atrophy, not elsewhere classified, other site: Secondary | ICD-10-CM | POA: Diagnosis not present

## 2019-03-19 DIAGNOSIS — M7501 Adhesive capsulitis of right shoulder: Secondary | ICD-10-CM | POA: Diagnosis not present

## 2019-03-19 DIAGNOSIS — G8929 Other chronic pain: Secondary | ICD-10-CM

## 2019-03-19 DIAGNOSIS — M25511 Pain in right shoulder: Secondary | ICD-10-CM

## 2019-03-19 NOTE — Progress Notes (Signed)
Office Visit Note   Patient: Marie Richardson           Date of Birth: 31-Dec-1960           MRN: [id number] Visit Date: 03/19/2019              Requested by: Wayland Salinas, MD [address] [place],  [place] [address] PCP: Wayland Salinas, MD   Assessment & Plan: Visit Diagnoses:  1. 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 Ulnar neuropathy at elbow of right upper extremity   4. Muscle atrophy of upper extremity   5. Weakness of shoulder   6. Chronic right shoulder pain     Plan: Avoid overhead lifting and overhead use of the arms. Do not lift greater than 10 lbs. Tylenol ES one every 6-8 hours for pain and inflamation.  Home exercise program. Shoulder MRI right with arthrogram to assess for adhesive capsulitis and for intrinsice shoulde pain.  Follow-Up Instructions: No follow-ups on file.   Orders:  Orders Placed This Encounter  Procedures  . MR SHOULDER RIGHT W CONTRAST  . DG Arthro Shoulder Right   No orders of the defined types were placed in this encounter.     Procedures: No procedures performed   Clinical Data: No additional findings.   Subjective: Chief Complaint  Patient presents with  . Right Arm - Pain, Follow-up    58 year old female left handed with persistent right elbow pain and pain with extension of the right elbow and persistent pain radiating into the right forearm and right palmar ulnar hand. Hand block of the ulnar n above the right elbow but this only provided about 1-2 hours of relief and her pain has returned. The results of the ultrasound it self suggests that there is anterior subcutaneous transposition of the ulnar n. She has pain in the right shoulder, pain with lying on the right shoulder and with reaching with the right arm. Reaching to turn the light off at bedside is painful and she has to get out of bed to turn off her light at night.   Review of Systems    Objective: Vital Signs: BP 126/81   Pulse 81   Ht 5\' 5"  (1.651 m)   Wt 142 lb (64.4 kg)   BMI 23.63 kg/m   Physical Exam  Right Shoulder Exam   Tenderness  The patient is experiencing tenderness in the acromion.  Range of Motion  Active abduction: abnormal  Passive abduction: abnormal  Extension: abnormal  External rotation:  80 abnormal  Internal rotation 0 degrees: abnormal  Internal rotation 90 degrees: 60   Muscle Strength  Abduction: 5/5  Internal rotation: 5/5  External rotation: 5/5  Supraspinatus: 5/5  Subscapularis: 5/5  Biceps: 5/5   Tests  Apprehension: negative Hawkins test: negative Cross arm: negative Impingement: negative Drop arm: negative  Other  Erythema: absent Scars: absent Sensation: normal Pulse: present      Specialty Comments:  No specialty comments available.  Imaging: No results found.   PMFS History: Patient Active Problem List   Diagnosis Date Noted  . Other spondylosis with radiculopathy, cervical region 03/26/2017    Priority: High    Class: Temporary  . S/P cervical spinal fusion 06/20/2018  . Status post laminectomy 03/26/2017   Past Medical History:  Diagnosis Date  . Arthritis    neck  . Asthma   . Complication of anesthesia  pt told she should always have a "pediatric intubation tube"  . 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 Endometriosis   . High cholesterol   . Pneumonia   . PONV (postoperative nausea and vomiting)     Family History  Problem Relation Age of Onset  . Heart attack Mother   . Arthritis Mother   . High Cholesterol Mother   . High Cholesterol Father   . Hypertension Father   . Heart attack Father     Past Surgical History:  Procedure Laterality Date  . ABDOMINAL HYSTERECTOMY  2000  . ANTERIOR CERVICAL DECOMP/DISCECTOMY FUSION N/A 06/20/2018   Procedure: ANTERIOR CERVICAL DISCECTOMY AND FUSION C4-5 AND C5-6 WITH PLATES, SCREWS, ALLOGRAFT BONE GRAFT AND  LOCAL BONE GRAFT;  Surgeon: Jessy Oto, MD;  Location: [place];  Service: Orthopedics;  Laterality: N/A;  . APPENDECTOMY     secondary to endometriosis  . CARPAL TUNNEL RELEASE Right   . COLONOSCOPY    . LAPAROSCOPIC ENDOMETRIOSIS FULGURATION     pt had 7 surgeries related to endometriosis  . POSTERIOR CERVICAL FUSION/FORAMINOTOMY N/A 03/26/2017   Procedure: Right C5-6 and C6-7 Foraminotomies;  Surgeon: Jessy Oto, MD;  Location: [place];  Service: Orthopedics;  Laterality: N/A;  . ULNAR TUNNEL RELEASE Right 09/19/2017   Social History   Occupational History  . Not on file  Tobacco Use  . Smoking status: Never Smoker  . Smokeless tobacco: Never Used  Substance and Sexual Activity  . Alcohol use: Yes    Comment: very rarely  . Drug use: No  . Sexual activity: Not on file

## 2019-03-19 NOTE — Patient Instructions (Signed)
Avoid overhead lifting and overhead use of the arms. Do not lift greater than 10 lbs. Tylenol ES one every 6-8 hours for pain and inflamation.  Home exercise program. Shoulder MRI right with arthrogram to assess for adhesive capsulitis and for intrinsice shoulde pain.

## 2019-03-20 ENCOUNTER — Encounter: Payer: Self-pay | Admitting: Specialist

## 2019-03-25 ENCOUNTER — Encounter: Payer: Self-pay | Admitting: *Deleted

## 2019-03-26 ENCOUNTER — Ambulatory Visit: Payer: PRIVATE HEALTH INSURANCE | Admitting: Rheumatology

## 2019-04-08 ENCOUNTER — Ambulatory Visit: Payer: No Typology Code available for payment source | Admitting: Specialist

## 2019-04-21 ENCOUNTER — Encounter: Payer: Self-pay | Admitting: Specialist

## 2019-05-06 ENCOUNTER — Ambulatory Visit: Payer: No Typology Code available for payment source | Admitting: Specialist

## 2019-05-07 ENCOUNTER — Encounter: Payer: Self-pay | Admitting: Specialist

## 2019-05-28 ENCOUNTER — Ambulatory Visit
Admission: RE | Admit: 2019-05-28 | Discharge: 2019-05-28 | Disposition: A | Discharge status: Home / Self Care | Payer: No Typology Code available for payment source | Source: Ambulatory Visit | Attending: Specialist | Admitting: Specialist

## 2019-05-28 ENCOUNTER — Other Ambulatory Visit: Payer: Self-pay

## 2019-05-28 DIAGNOSIS — G8929 Other chronic pain: Secondary | ICD-10-CM

## 2019-05-28 DIAGNOSIS — M25511 Pain in right shoulder: Secondary | ICD-10-CM

## 2019-05-28 DIAGNOSIS — M6258 Muscle wasting and atrophy, not elsewhere classified, other site: Secondary | ICD-10-CM

## 2019-05-28 DIAGNOSIS — G5621 Lesion of ulnar nerve, right upper limb: Secondary | ICD-10-CM

## 2019-05-28 DIAGNOSIS — M7501 Adhesive capsulitis of right shoulder: Secondary | ICD-10-CM

## 2019-05-28 DIAGNOSIS — R29898 Other symptoms and signs involving the musculoskeletal system: Secondary | ICD-10-CM

## 2019-05-28 MED ORDER — IOPAMIDOL (ISOVUE-M 200) INJECTION 41%
12.0000 mL | Freq: Once | INTRAMUSCULAR | Status: AC
  Administered 2019-05-28: 15:00:00 12 mL via INTRA_ARTICULAR

## 2019-06-03 ENCOUNTER — Ambulatory Visit: Payer: No Typology Code available for payment source | Admitting: Specialist

## 2019-06-04 ENCOUNTER — Ambulatory Visit: Payer: No Typology Code available for payment source | Admitting: Specialist

## 2019-06-04 ENCOUNTER — Encounter: Payer: Self-pay | Admitting: Specialist

## 2019-06-04 ENCOUNTER — Other Ambulatory Visit: Payer: Self-pay

## 2019-06-04 ENCOUNTER — Ambulatory Visit (INDEPENDENT_AMBULATORY_CARE_PROVIDER_SITE_OTHER): Payer: No Typology Code available for payment source | Admitting: Specialist

## 2019-06-04 VITALS — BP 129/80 | HR 85 | Ht 65.0 in | Wt 138.0 lb

## 2019-06-04 DIAGNOSIS — M19011 Primary osteoarthritis, right shoulder: Secondary | ICD-10-CM

## 2019-06-04 DIAGNOSIS — M7501 Adhesive capsulitis of right shoulder: Secondary | ICD-10-CM | POA: Diagnosis not present

## 2019-06-04 DIAGNOSIS — S86111A Strain of other muscle(s) and tendon(s) of posterior muscle group at lower leg level, right leg, initial encounter: Secondary | ICD-10-CM | POA: Diagnosis not present

## 2019-06-04 NOTE — Progress Notes (Signed)
Office Visit Note   Patient: Marie Richardson           Date of Birth: [birth year]           MRN: [id number] Visit Date: 06/04/2019              Requested by: Wayland Salinas, MD [address] [place],  [place] [address] PCP: Wayland Salinas, MD   Assessment & Plan: Visit Diagnoses:  1. Adhesive capsulitis of right shoulder   2. Primary osteoarthritis of right shoulder     Plan: Avoid overhead lifting and overhead use of the arms. Do not lift greater than 10 lbs. Tylenol ES one every 6-8 hours for pain and inflamation. I want you to have evaluation by Dr. Marlou Sa our shoulder specialist for a frozen right shoulder for consideration of manipulation.  Home exercise program.  Follow-Up Instructions: Return in about 2 weeks (around 06/18/2019) for Dr. Marlou Sa for evaluation and treatment of right shoulder adhesive capsulitis. .   Orders:  No orders of the defined types were placed in this encounter.  No orders of the defined types were placed in this encounter.     Procedures: No procedures performed   Clinical Data: No additional findings.   Subjective: Chief Complaint  Patient presents with  . Right Shoulder - Follow-up    MRI Review    58 year old right handed female with history of right wrist surgery with persistent right shoulder pain. She finds relief with adduction of the right shoulder when trying to sleep. There is pain with lying on the right shoulder which is her preferred shoulder. She is unable to preform overhead use of the right arm. She has had right C5-6 and C6-7 foraminotomies the ACDF C5-6 and C4-5 for spondylosis. More recently she has been told she may have a thoracic outlet syndrome but there is no clinical finding to confirm this. Recent MRI of the right shoulder was read as normal yet she has persistent painful ROM of the right shoulder and stiffness.     Review of Systems  Constitutional: Negative.   HENT: Negative.    Eyes: Negative.   Respiratory: Negative.   Cardiovascular: Negative.   Endocrine: Negative.   Genitourinary: Negative.   Musculoskeletal: Negative.   Skin: Negative.   Allergic/Immunologic: Negative.   Neurological: Negative.   Hematological: Negative.   Psychiatric/Behavioral: Negative.      Objective: Vital Signs: BP 129/80 (BP Location: Left Arm, Patient Position: Sitting)   Pulse 85   Ht 5\' 5"  (1.651 m)   Wt 138 lb (62.6 kg)   BMI 22.96 kg/m   Physical Exam Constitutional:      Appearance: She is well-developed.  HENT:     Head: Normocephalic and atraumatic.  Eyes:     Pupils: Pupils are equal, round, and reactive to light.  Neck:     Musculoskeletal: Normal range of motion and neck supple.  Pulmonary:     Effort: Pulmonary effort is normal.     Breath sounds: Normal breath sounds.  Abdominal:     General: Bowel sounds are normal.     Palpations: Abdomen is soft.  Skin:    General: Skin is warm and dry.  Neurological:     Mental Status: She is alert and oriented to person, place, and time.  Psychiatric:        Behavior: Behavior normal.        Thought Content: Thought content normal.  Judgment: Judgment normal.     Right Shoulder Exam   Tenderness  The patient is experiencing no tenderness.  Range of Motion  Active abduction: abnormal  Passive abduction: abnormal  Extension: normal  External rotation: normal  Forward flexion: normal  Internal rotation 0 degrees: normal  Internal rotation 90 degrees: normal   Muscle Strength  Abduction: 4/5  Internal rotation: 4/5  External rotation: 4/5  Supraspinatus: 5/5  Subscapularis: 5/5  Biceps: 5/5   Tests  Apprehension: negative Impingement: negative  Other  Pulse: present   Left Shoulder Exam   Muscle Strength  Abduction: 5/5  Internal rotation: 5/5  External rotation: 5/5  Supraspinatus: 5/5  Subscapularis: 5/5  Biceps: 5/5   Tests  Apprehension: negative Impingement:  negative  Other  Pulse: present       Specialty Comments:  No specialty comments available.  Imaging: No results found.   PMFS History: Patient Active Problem List   Diagnosis Date Noted  . Other spondylosis with radiculopathy, cervical region 03/26/2017    Priority: High    Class: Temporary  . S/P cervical spinal fusion 06/20/2018  . Status post laminectomy 03/26/2017   Past Medical History:  Diagnosis Date  . Arthritis    neck  . Asthma   . Complication of anesthesia    pt told she should always have a "pediatric intubation tube"  . 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 Endometriosis   . High cholesterol   . Pneumonia   . PONV (postoperative nausea and vomiting)     Family History  Problem Relation Age of Onset  . Heart attack Mother   . Arthritis Mother   . High Cholesterol Mother   . High Cholesterol Father   . Hypertension Father   . Heart attack Father     Past Surgical History:  Procedure Laterality Date  . ABDOMINAL HYSTERECTOMY  2000  . ANTERIOR CERVICAL DECOMP/DISCECTOMY FUSION N/A 06/20/2018   Procedure: ANTERIOR CERVICAL DISCECTOMY AND FUSION C4-5 AND C5-6 WITH PLATES, SCREWS, ALLOGRAFT BONE GRAFT AND LOCAL BONE GRAFT;  Surgeon: Jessy Oto, MD;  Location: [facility];  Service: Orthopedics;  Laterality: N/A;  . APPENDECTOMY     secondary to endometriosis  . CARPAL TUNNEL RELEASE Right   . COLONOSCOPY    . LAPAROSCOPIC ENDOMETRIOSIS FULGURATION     pt had 7 surgeries related to endometriosis  . POSTERIOR CERVICAL FUSION/FORAMINOTOMY N/A 03/26/2017   Procedure: Right C5-6 and C6-7 Foraminotomies;  Surgeon: Jessy Oto, MD;  Location: [facility];  Service: Orthopedics;  Laterality: N/A;  . ULNAR TUNNEL RELEASE Right 09/19/2017   Social History   Occupational History  . Not on file  Tobacco Use  . Smoking status: Never Smoker  . Smokeless tobacco: Never Used  Substance and Sexual Activity  . Alcohol use: Yes    Comment:  very rarely  . Drug use: No  . Sexual activity: Not on file

## 2019-06-04 NOTE — Patient Instructions (Addendum)
Avoid overhead lifting and overhead use of the arms. Do not lift greater than 10 lbs. Tylenol ES one every 6-8 hours for pain and inflamation. I want you to have evaluation by Dr. Marlou Sa our shoulder specialist for a frozen right shoulder for consideration of manipulation.  Home exercise program. Right posterior tibial tendon splint

## 2019-06-18 ENCOUNTER — Ambulatory Visit: Payer: No Typology Code available for payment source | Admitting: Orthopedic Surgery

## 2019-06-18 ENCOUNTER — Encounter: Payer: Self-pay | Admitting: Orthopedic Surgery

## 2019-06-18 DIAGNOSIS — M7501 Adhesive capsulitis of right shoulder: Secondary | ICD-10-CM | POA: Diagnosis not present

## 2019-06-18 MED ORDER — METHYLPREDNISOLONE ACETATE 40 MG/ML IJ SUSP
40.0000 mg | INTRAMUSCULAR | Status: AC | PRN
  Administered 2019-06-18: 10:00:00 40 mg via INTRA_ARTICULAR

## 2019-06-18 MED ORDER — LIDOCAINE HCL 1 % IJ SOLN
5.0000 mL | INTRAMUSCULAR | Status: AC | PRN
  Administered 2019-06-18: 5 mL

## 2019-06-18 MED ORDER — BUPIVACAINE HCL 0.5 % IJ SOLN
9.0000 mL | INTRAMUSCULAR | Status: AC | PRN
  Administered 2019-06-18: 10:00:00 9 mL via INTRA_ARTICULAR

## 2019-06-18 NOTE — Progress Notes (Signed)
Office Visit Note   Patient: Marie Richardson           Date of Birth: [birth year]           MRN: [id number] Visit Date: 06/18/2019 Requested by: Wayland Salinas, MD [address] [place],   [id number] PCP: Wayland Salinas, MD  Subjective: Chief Complaint  Patient presents with  . Right Shoulder - Pain    HPI: Marie Richardson is a patient with right shoulder pain.  Here for evaluation for possible adhesive capsulitis.  She has a very complicated history involving the right upper extremity including cervical spine surgery requiring foraminotomies and fusion.  She also has had cubital tunnel syndrome surgery as well as carpal tunnel release.  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  Describes pain running from the shoulder down to the hand with some burning sensation on both the posterior aspect of the humerus as well as medial aspect of the humeral region.  She states that her shoulder blade stays numb.  Small movements aggravate the right shoulder.  She had an MRI scan of the shoulder which was reported as normal.  She has had 1 injection in July by Dr. Rosey Bath which gave her good relief but that did not last.  She states that the shoulder pain is causing her not to sleep well.  She lives in [place].  She has had a course of physical therapy for the shoulder and her upper extremity in general.              ROS: All systems reviewed are negative as they relate to the chief complaint within the history of present illness.  Patient denies  fevers or chills.   Assessment & Plan: Visit Diagnoses:  1. Adhesive capsulitis of right shoulder     Plan: Impression is I agree with Dr. Francisca December assessment that she has early adhesive capsulitis.  Think this is a very difficult problem for Marie Richardson.  I did an intra-articular cortisone injection today to see if that will help with giving her some pain relief so she can get the shoulder moving.  She is at  somewhat high operative risk due to multiple other surgeries in the neck elbow and hand.  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  I think we will have to consider which is the worst outcome stiff shoulder with some limitation of function and some pain versus aggravation reflect sympathetic dystrophy from the surgery.  For those reasons I think conservative route is the way to go first.  Injected the shoulder today and we will see her back in 8 weeks and then decide for or against further intervention at that time.  Follow-Up Instructions: Return in about 8 weeks (around 08/13/2019).   Orders:  No orders of the defined types were placed in this encounter.  No orders of the defined types were placed in this encounter.     Procedures: Large Joint Inj: R glenohumeral on 06/18/2019 10:09 AM Indications: diagnostic evaluation and pain Details: 18 G 1.5 in needle, posterior approach  Arthrogram: No  Medications: 9 mL bupivacaine 0.5 %; 40 mg methylPREDNISolone acetate 40 MG/ML; 5 mL lidocaine 1 % Outcome: tolerated well, no immediate complications Procedure, treatment alternatives, risks and benefits explained, specific risks discussed. Consent was given by the patient. Immediately prior to procedure a time out was  called to verify the correct patient, procedure, equipment, support staff and site/side marked as required. Patient was prepped and draped in the usual sterile fashion.       Clinical Data: No additional findings.  Objective: Vital Signs: There were no vitals taken for this visit.  Physical Exam:   Constitutional: Patient appears well-developed HEENT:  Head: Normocephalic Eyes:EOM are normal Neck: Normal range of motion Cardiovascular: Normal rate Pulmonary/chest: Effort normal Neurologic: Patient is alert Skin: Skin is warm Psychiatric: Patient  has normal mood and affect    Ortho Exam: Ortho exam demonstrates pretty reasonable cervical spine range of motion.  She wears a sleeve on her right elbow.  She has diminished grip strength and diminished function in the right hand in general.  She does have limitation of passive forward flexion and abduction on the right compared to the left.  She is got full forward flexion on the left and about 120 of forward flexion on the right.  Isolated glenohumeral abduction is below 90 on the right and about 110 on the left.  Rotator cuff strength is good infraspinatus supraspinatus and subscap muscle testing.  I will detect any color difference or temperature difference right shoulder versus left shoulder.  No AC joint tenderness is present.  T  Specialty Comments:  No specialty comments available.  Imaging: No results found.   PMFS History: Patient Active Problem List   Diagnosis Date Noted  . S/P cervical spinal fusion 06/20/2018  . Status post laminectomy 03/26/2017  . Other spondylosis with radiculopathy, cervical region 03/26/2017    Class: Temporary   Past Medical History:  Diagnosis Date  . Arthritis    neck  . Asthma   . Complication of anesthesia    pt told she should always have a "pediatric intubation tube"  . 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 Endometriosis   . High cholesterol   . Pneumonia   . PONV (postoperative nausea and vomiting)     Family History  Problem Relation Age of Onset  . Heart attack Mother   . Arthritis Mother   . High Cholesterol Mother   . High Cholesterol Father   . Hypertension Father   . Heart attack Father     Past Surgical History:  Procedure Laterality Date  . ABDOMINAL HYSTERECTOMY  2000  . ANTERIOR CERVICAL DECOMP/DISCECTOMY FUSION N/A 06/20/2018   Procedure: ANTERIOR CERVICAL DISCECTOMY AND FUSION C4-5 AND C5-6 WITH PLATES, SCREWS, ALLOGRAFT BONE GRAFT AND LOCAL BONE GRAFT;  Surgeon: Jessy Oto, MD;  Location:  [facility];  Service: Orthopedics;  Laterality: N/A;  . APPENDECTOMY     secondary to endometriosis  . CARPAL TUNNEL RELEASE Right   . COLONOSCOPY    . LAPAROSCOPIC ENDOMETRIOSIS FULGURATION     pt had 7 surgeries related to endometriosis  . POSTERIOR CERVICAL FUSION/FORAMINOTOMY N/A 03/26/2017   Procedure: Right C5-6 and C6-7 Foraminotomies;  Surgeon: Jessy Oto, MD;  Location: [facility];  Service: Orthopedics;  Laterality: N/A;  . ULNAR TUNNEL RELEASE Right 09/19/2017   Social History   Occupational History  . Not on file  Tobacco Use  . Smoking status: Never Smoker  . Smokeless tobacco: Never Used  Substance and Sexual Activity  . Alcohol use: Yes    Comment: very rarely  . Drug use: No  . Sexual activity: Not on file

## 2019-06-23 ENCOUNTER — Other Ambulatory Visit: Payer: Self-pay | Admitting: Neurology

## 2019-06-23 DIAGNOSIS — M6281 Muscle weakness (generalized): Secondary | ICD-10-CM

## 2019-06-28 ENCOUNTER — Ambulatory Visit (INDEPENDENT_AMBULATORY_CARE_PROVIDER_SITE_OTHER): Payer: No Typology Code available for payment source

## 2019-06-28 ENCOUNTER — Other Ambulatory Visit: Payer: Self-pay

## 2019-06-28 DIAGNOSIS — M6281 Muscle weakness (generalized): Secondary | ICD-10-CM | POA: Diagnosis not present

## 2019-07-01 ENCOUNTER — Encounter: Payer: Self-pay | Admitting: Specialist

## 2019-07-01 ENCOUNTER — Ambulatory Visit (INDEPENDENT_AMBULATORY_CARE_PROVIDER_SITE_OTHER): Payer: No Typology Code available for payment source | Admitting: Specialist

## 2019-07-01 ENCOUNTER — Other Ambulatory Visit: Payer: Self-pay

## 2019-07-01 VITALS — BP 146/79 | HR 82 | Ht 65.0 in | Wt 138.0 lb

## 2019-07-01 DIAGNOSIS — G5691 Unspecified mononeuropathy of right upper limb: Secondary | ICD-10-CM

## 2019-07-01 DIAGNOSIS — G5641 Causalgia of right upper limb: Secondary | ICD-10-CM

## 2019-07-01 DIAGNOSIS — M7501 Adhesive capsulitis of right shoulder: Secondary | ICD-10-CM | POA: Diagnosis not present

## 2019-07-01 NOTE — Patient Instructions (Addendum)
Plan: Consider a second opinion with a Thoracic outlet syndrome specialist at [facility]. Avoid overhead lifting and overhead use of the arms. Do not lift greater than 5 lbs. Adjust head rest in vehicle to prevent hyperextension if rear ended. Take extra precautions to avoid falling. Fall Prevention and Home Safety Falls cause injuries and can affect all age groups. It is possible to use preventive measures to significantly decrease the likelihood of falls. There are many simple measures which can make your home safer and prevent falls. OUTDOORS  Repair cracks and edges of walkways and driveways.  Remove high doorway thresholds.  Trim shrubbery on the main path into your home.  Have good outside lighting.  Clear walkways of tools, rocks, debris, and clutter.  Check that handrails are not broken and are securely fastened. Both sides of steps should have handrails.  Have leaves, snow, and ice cleared regularly.  Use sand or salt on walkways during winter months.  In the garage, clean up grease or oil spills. BATHROOM  Install night lights.  Install grab bars by the toilet and in the tub and shower.  Use non-skid mats or decals in the tub or shower.  Place a plastic non-slip stool in the shower to sit on, if needed.  Keep floors dry and clean up all water on the floor immediately.  Remove soap buildup in the tub or shower on a regular basis.  Secure bath mats with non-slip, double-sided rug tape.  Remove throw rugs and tripping hazards from the floors. BEDROOMS  Install night lights.  Make sure a bedside light is easy to reach.  Do not use oversized bedding.  Keep a telephone by your bedside.  Have a firm chair with side arms to use for getting dressed.  Remove throw rugs and tripping hazards from the floor. KITCHEN  Keep handles on pots and pans turned toward the center of the stove. Use back burners when possible.  Clean up spills quickly and allow  time for drying.  Avoid walking on wet floors.  Avoid hot utensils and knives.  Position shelves so they are not too high or low.  Place commonly used objects within easy reach.  If necessary, use a sturdy step stool with a grab bar when reaching.  Keep electrical cables out of the way.  Do not use floor polish or wax that makes floors slippery. If you must use wax, use non-skid floor wax.  Remove throw rugs and tripping hazards from the floor. STAIRWAYS  Never leave objects on stairs.  Place handrails on both sides of stairways and use them. Fix any loose handrails. Make sure handrails on both sides of the stairways are as long as the stairs.  Check carpeting to make sure it is firmly attached along stairs. Make repairs to worn or loose carpet promptly.  Avoid placing throw rugs at the top or bottom of stairways, or properly secure the rug with carpet tape to prevent slippage. Get rid of throw rugs, if possible.  Have an electrician put in a light switch at the top and bottom of the stairs. OTHER FALL PREVENTION TIPS  Wear low-heel or rubber-soled shoes that are supportive and fit well. Wear closed toe shoes.  When using a stepladder, make sure it is fully opened and both spreaders are firmly locked. Do not climb a closed stepladder.  Add color or contrast paint or tape to grab bars and handrails in your home. Place contrasting color strips on first and last  steps.  Learn and use mobility aids as needed. Install an electrical emergency response system.  Turn on lights to avoid dark areas. Replace light bulbs that burn out immediately. Get light switches that glow.  Arrange furniture to create clear pathways. Keep furniture in the same place.  Firmly attach carpet with non-skid or double-sided tape.  Eliminate uneven floor surfaces.  Select a carpet pattern that does not visually hide the edge of steps.  Be aware of all pets. OTHER HOME SAFETY TIPS  Set the water  temperature for 120 F (48.8 C).  Keep emergency numbers on or near the telephone.  Keep smoke detectors on every level of the home and near sleeping areas. Document Released: 07/13/2002 Document Revised: 01/22/2012 Document Reviewed: 10/12/2011 [facility] Patient Information 2014 [place]. Over head pulley exercises ROM exercises all joints right arm and neck. Hot showers [employer]  Hemp CBD capsules, amazon.com 5,000-7,000 mg per bottle, 60 capsules per bottle, take one capsule twice a day.

## 2019-07-01 NOTE — Progress Notes (Signed)
Office Visit Note   Patient: Marie Richardson           Date of Birth: 04/06/1961           MRN: [id number] Visit Date: 07/01/2019              Requested by: Wayland Salinas, MD [address] [place],  [place] [address] PCP: Wayland Salinas, MD   Assessment & Plan: Visit Diagnoses:  1. Complex regional pain syndrome type 2 of right upper extremity   2. Adhesive capsulitis of right shoulder   3. Neuropathy, arm, right     Plan:  Consider a second opinion with a Thoracic outlet syndrome specialist at [facility]. Avoid overhead lifting and overhead use of the arms. Do not lift greater than 5 lbs. Adjust head rest in vehicle to prevent hyperextension if rear ended. Take extra precautions to avoid falling. Fall Prevention and Home Safety Falls cause injuries and can affect all age groups. It is possible to use preventive measures to significantly decrease the likelihood of falls. There are many simple measures which can make your home safer and prevent falls. OUTDOORS  Repair cracks and edges of walkways and driveways.  Remove high doorway thresholds.  Trim shrubbery on the main path into your home.  Have good outside lighting.  Clear walkways of tools, rocks, debris, and clutter.  Check that handrails are not broken and are securely fastened. Both sides of steps should have handrails.  Have leaves, snow, and ice cleared regularly.  Use sand or salt on walkways during winter months.  In the garage, clean up grease or oil spills. BATHROOM  Install night lights.  Install grab bars by the toilet and in the tub and shower.  Use non-skid mats or decals in the tub or shower.  Place a plastic non-slip stool in the shower to sit on, if needed.  Keep floors dry and clean up all water on the floor immediately.  Remove soap buildup in the tub or shower on a regular basis.  Secure bath mats with non-slip, double-sided rug tape.   Remove throw rugs and tripping hazards from the floors. BEDROOMS  Install night lights.  Make sure a bedside light is easy to reach.  Do not use oversized bedding.  Keep a telephone by your bedside.  Have a firm chair with side arms to use for getting dressed.  Remove throw rugs and tripping hazards from the floor. KITCHEN  Keep handles on pots and pans turned toward the center of the stove. Use back burners when possible.  Clean up spills quickly and allow time for drying.  Avoid walking on wet floors.  Avoid hot utensils and knives.  Position shelves so they are not too high or low.  Place commonly used objects within easy reach.  If necessary, use a sturdy step stool with a grab bar when reaching.  Keep electrical cables out of the way.  Do not use floor polish or wax that makes floors slippery. If you must use wax, use non-skid floor wax.  Remove throw rugs and tripping hazards from the floor. STAIRWAYS  Never leave objects on stairs.  Place handrails on both sides of stairways and use them. Fix any loose handrails. Make sure handrails on both sides of the stairways are as long as the stairs.  Check carpeting to make sure it is firmly attached along stairs. Make repairs to worn or loose carpet promptly.  Avoid placing throw rugs  at the top or bottom of stairways, or properly secure the rug with carpet tape to prevent slippage. Get rid of throw rugs, if possible.  Have an electrician put in a light switch at the top and bottom of the stairs. OTHER FALL PREVENTION TIPS  Wear low-heel or rubber-soled shoes that are supportive and fit well. Wear closed toe shoes.  When using a stepladder, make sure it is fully opened and both spreaders are firmly locked. Do not climb a closed stepladder.  Add color or contrast paint or tape to grab bars and handrails in your home. Place contrasting color strips on first and last steps.  Learn and use mobility aids as needed.  Install an electrical emergency response system.  Turn on lights to avoid dark areas. Replace light bulbs that burn out immediately. Get light switches that glow.  Arrange furniture to create clear pathways. Keep furniture in the same place.  Firmly attach carpet with non-skid or double-sided tape.  Eliminate uneven floor surfaces.  Select a carpet pattern that does not visually hide the edge of steps.  Be aware of all pets. OTHER HOME SAFETY TIPS  Set the water temperature for 120 F (48.8 C).  Keep emergency numbers on or near the telephone.  Keep smoke detectors on every level of the home and near sleeping areas. Document Released: 07/13/2002 Document Revised: 01/22/2012 Document Reviewed: 10/12/2011 [facility] Patient Information 2014 [place]. Over head pulley exercises ROM exercises all joints right arm and neck. Hot showers Salonpas  Hemp CBD capsules, amazon.com 5,000-7,000 mg per bottle, 60 capsules per bottle, take one capsule twice a day.     Follow-Up Instructions: No follow-ups on file.   Orders:  No orders of the defined types were placed in this encounter.  No orders of the defined types were placed in this encounter.     Procedures: No procedures performed   Clinical Data: No additional findings.   Subjective: Chief Complaint  Patient presents with  . Right Arm - Follow-up    HPI  Review of Systems   Objective: Vital Signs: BP (!) 146/79 (BP Location: Left Arm, Patient Position: Sitting)   Pulse 82   Ht 5\' 5"  (1.651 m)   Wt 138 lb (62.6 kg)   BMI 22.96 kg/m   Physical Exam  Ortho Exam  Specialty Comments:  No specialty comments available.  Imaging: No results found.   PMFS History: Patient Active Problem List   Diagnosis Date Noted  . Other spondylosis with radiculopathy, cervical region 03/26/2017    Priority: High    Class: Temporary  . S/P cervical spinal fusion 06/20/2018  . Status post laminectomy  03/26/2017   Past Medical History:  Diagnosis Date  . Arthritis    neck  . Asthma   . Complication of anesthesia    pt told she should always have a "pediatric intubation tube"  . 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 Endometriosis   . High cholesterol   . Pneumonia   . PONV (postoperative nausea and vomiting)     Family History  Problem Relation Age of Onset  . Heart attack Mother   . Arthritis Mother   . High Cholesterol Mother   . High Cholesterol Father   . Hypertension Father   . Heart attack Father     Past Surgical History:  Procedure Laterality Date  . ABDOMINAL HYSTERECTOMY  2000  . ANTERIOR CERVICAL DECOMP/DISCECTOMY FUSION N/A 06/20/2018   Procedure: ANTERIOR CERVICAL  DISCECTOMY AND FUSION C4-5 AND C5-6 WITH PLATES, SCREWS, ALLOGRAFT BONE GRAFT AND LOCAL BONE GRAFT;  Surgeon: Jessy Oto, MD;  Location: [facility];  Service: Orthopedics;  Laterality: N/A;  . APPENDECTOMY     secondary to endometriosis  . CARPAL TUNNEL RELEASE Right   . COLONOSCOPY    . LAPAROSCOPIC ENDOMETRIOSIS FULGURATION     pt had 7 surgeries related to endometriosis  . POSTERIOR CERVICAL FUSION/FORAMINOTOMY N/A 03/26/2017   Procedure: Right C5-6 and C6-7 Foraminotomies;  Surgeon: Jessy Oto, MD;  Location: [facility];  Service: Orthopedics;  Laterality: N/A;  . ULNAR TUNNEL RELEASE Right 09/19/2017   Social History   Occupational History  . Not on file  Tobacco Use  . Smoking status: Never Smoker  . Smokeless tobacco: Never Used  Substance and Sexual Activity  . Alcohol use: Yes    Comment: very rarely  . Drug use: No  . Sexual activity: Not on file

## 2019-07-06 ENCOUNTER — Encounter: Payer: Self-pay | Admitting: Specialist

## 2019-07-28 ENCOUNTER — Other Ambulatory Visit: Payer: Self-pay | Admitting: Specialist

## 2019-07-28 ENCOUNTER — Encounter: Payer: Self-pay | Admitting: Specialist

## 2019-07-28 DIAGNOSIS — M5412 Radiculopathy, cervical region: Secondary | ICD-10-CM

## 2019-08-05 ENCOUNTER — Encounter: Payer: Self-pay | Admitting: Specialist

## 2019-08-05 ENCOUNTER — Ambulatory Visit (INDEPENDENT_AMBULATORY_CARE_PROVIDER_SITE_OTHER): Payer: No Typology Code available for payment source | Admitting: Specialist

## 2019-08-05 ENCOUNTER — Other Ambulatory Visit: Payer: Self-pay

## 2019-08-05 ENCOUNTER — Telehealth: Payer: Self-pay | Admitting: Specialist

## 2019-08-05 VITALS — BP 130/75 | HR 95 | Ht 65.0 in | Wt 138.0 lb

## 2019-08-05 DIAGNOSIS — G5621 Lesion of ulnar nerve, right upper limb: Secondary | ICD-10-CM

## 2019-08-05 DIAGNOSIS — M542 Cervicalgia: Secondary | ICD-10-CM | POA: Diagnosis not present

## 2019-08-05 DIAGNOSIS — Q761 Klippel-Feil syndrome: Secondary | ICD-10-CM | POA: Diagnosis not present

## 2019-08-05 DIAGNOSIS — M5412 Radiculopathy, cervical region: Secondary | ICD-10-CM

## 2019-08-05 DIAGNOSIS — R200 Anesthesia of skin: Secondary | ICD-10-CM | POA: Diagnosis not present

## 2019-08-05 DIAGNOSIS — R202 Paresthesia of skin: Secondary | ICD-10-CM

## 2019-08-05 MED ORDER — GABAPENTIN 300 MG PO CAPS: 300.0000 mg | ORAL_CAPSULE | Freq: Every day | ORAL | 3 refills | Status: DC

## 2019-08-05 NOTE — Telephone Encounter (Signed)
Marjory Lies from Dr. Sammuel Bailiff office called. Dr. Cleda Mccreedy would like to know the restrictions given to her.

## 2019-08-05 NOTE — Telephone Encounter (Signed)
Marjory Lies from Dr. Sammu[facility] Bailiff office called. Dr. Cleda Mccreedy would like to know the restrictions given to her. His call back number is [phone]

## 2019-08-05 NOTE — Progress Notes (Signed)
Office Visit Note   Patient: Marie Richardson           Date of Birth: 1961-06-19           MRN: [id number] Visit Date: 08/05/2019              Requested by: Wayland Salinas, MD [address] [place],  [place] [address] PCP: Wayland Salinas, MD   Assessment & Plan: Visit Diagnoses:  1. Cervicalgia   2. Cervical fusion syndrome   3. Numbness and tingling of right arm   4. Ulnar neuropathy of right upper extremity     Plan: Avoid overhead lifting and overhead use of the arms. Do not lift greater than 5 lbs. Adjust head rest in vehicle to prevent hyperextension if rear ended. Take extra precautions to avoid falling. CT Scan of the cervical spine to assess healing of the fusion sites. Spoke with Dr. Shela Leff and he will order an MRI angiogram of the right brachial plexus for concerns of diffuse right UE weakness involving multiple nerve root and cord and nerve distributions.    Follow-Up Instructions: Return in about 4 weeks (around 09/02/2019).   Orders:  Orders Placed This Encounter  Procedures  . CT CERVICAL SPINE WO CONTRAST   No orders of the defined types were placed in this encounter.     Procedures: No procedures performed   Clinical Data: No additional findings.   Subjective: Chief Complaint  Patient presents with  . Right Arm - Follow-up    HPI  Review of Systems   Objective: Vital Signs: BP 130/75 (BP Location: Left Arm, Patient Position: Sitting)   Pulse 95   Ht 5\' 5"  (1.651 m)   Wt 138 lb (62.6 kg)   BMI 22.96 kg/m   Physical Exam  Ortho Exam  Specialty Comments:  No specialty comments available.  Imaging: No results found.   PMFS History: Patient Active Problem List   Diagnosis Date Noted  . Other spondylosis with radiculopathy, cervical region 03/26/2017    Priority: High    Class: Temporary  . S/P cervical spinal fusion 06/20/2018  . Status post laminectomy 03/26/2017   Past Medical History:    Diagnosis Date  . Arthritis    neck  . Asthma   . Complication of anesthesia    pt told she should always have a "pediatric intubation tube"  . 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 Endometriosis   . High cholesterol   . Pneumonia   . PONV (postoperative nausea and vomiting)     Family History  Problem Relation Age of Onset  . Heart attack Mother   . Arthritis Mother   . High Cholesterol Mother   . High Cholesterol Father   . Hypertension Father   . Heart attack Father     Past Surgical History:  Procedure Laterality Date  . ABDOMINAL HYSTERECTOMY  2000  . ANTERIOR CERVICAL DECOMP/DISCECTOMY FUSION N/A 06/20/2018   Procedure: ANTERIOR CERVICAL DISCECTOMY AND FUSION C4-5 AND C5-6 WITH PLATES, SCREWS, ALLOGRAFT BONE GRAFT AND LOCAL BONE GRAFT;  Surgeon: Jessy Oto, MD;  Location: [facility];  Service: Orthopedics;  Laterality: N/A;  . APPENDECTOMY     secondary to endometriosis  . CARPAL TUNNEL RELEASE Right   . COLONOSCOPY    . LAPAROSCOPIC ENDOMETRIOSIS FULGURATION     pt had 7 surgeries related to endometriosis  . POSTERIOR CERVICAL FUSION/FORAMINOTOMY N/A 03/26/2017   Procedure: Right C5-6 and C6-7 Foraminotomies;  Surgeon: Jessy Oto, MD;  Location: [facility];  Service: Orthopedics;  Laterality: N/A;  . ULNAR TUNNEL RELEASE Right 09/19/2017   Social History   Occupational History  . Not on file  Tobacco Use  . Smoking status: Never Smoker  . Smokeless tobacco: Never Used  Substance and Sexual Activity  . Alcohol use: Yes    Comment: very rarely  . Drug use: No  . Sexual activity: Not on file

## 2019-08-05 NOTE — Patient Instructions (Signed)
Avoid overhead lifting and overhead use of the arms. Do not lift greater than 5 lbs. Adjust head rest in vehicle to prevent hyperextension if rear ended. Take extra precautions to avoid falling. CT Scan of the cervical spine to assess healing of the fusion sites. Spoke with Dr. Shela Leff and he will order an MRI angiogram of the right brachial plexus for concerns of diffuse right UE weakness involving multiple nerve root and cord and nerve distributions.

## 2019-08-06 ENCOUNTER — Telehealth: Payer: Self-pay | Admitting: Specialist

## 2019-08-06 NOTE — Telephone Encounter (Signed)
Marie Richardson from Dr. Caryl Comes office called requesting a limitation/restriction form for patient's employment. Marie Richardson is requesting a call back from Dr. Otho Ket nurse or assistant. Dr. Caryl Comes office phone number is [phone].

## 2019-08-11 NOTE — Telephone Encounter (Signed)
Marie Richardson from Dr. Caryl Comes office called requesting a limitation/restriction form for patient's employment. Marie Richardson is requesting a call back from Dr. Otho Ket nurse or assistant. Dr. Caryl Comes office phone number is [phone].

## 2019-08-13 ENCOUNTER — Ambulatory Visit: Payer: No Typology Code available for payment source | Admitting: Orthopedic Surgery

## 2019-08-13 ENCOUNTER — Other Ambulatory Visit: Payer: No Typology Code available for payment source

## 2019-08-21 ENCOUNTER — Other Ambulatory Visit: Payer: Self-pay | Admitting: Neurology

## 2019-08-21 DIAGNOSIS — R29898 Other symptoms and signs involving the musculoskeletal system: Secondary | ICD-10-CM

## 2019-08-21 DIAGNOSIS — R202 Paresthesia of skin: Secondary | ICD-10-CM

## 2019-08-25 IMAGING — MR MRI OF THE RIGHT ELBOW WITHOUT CONTRAST
5 series · 40 of 40 positions shown · non-contrast
Comparison: None.

CLINICAL DATA: Right elbow and wrist pain for 3 years. History of
carpal tunnel surgery.

EXAM:
MRI OF THE RIGHT ELBOW WITHOUT CONTRAST
TECHNIQUE: Multiplanar, multisequence MR imaging of the elbow was performed. No
intravenous contrast was administered.

[Series 9: T1 · axial · 4.0mm · 0.44mm/px · z∈[-56,+93]mm · 9 of 35 slices shown (1 of 2)]
[im 1/35]
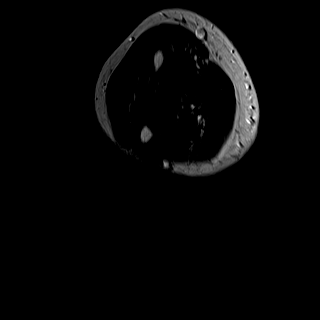
[im 5/35]
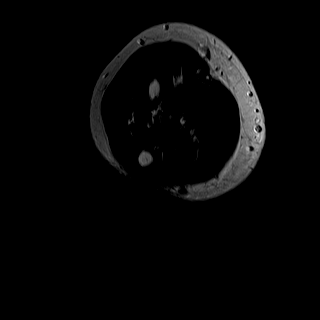
[im 9/35]
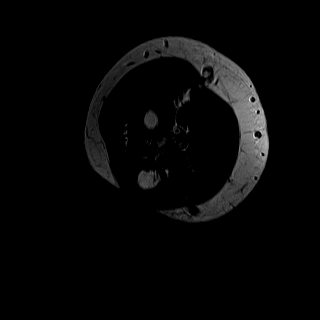
[im 13/35]
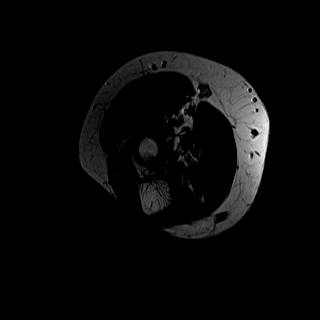
[im 18/35]
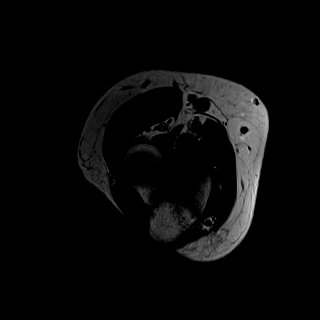
[im 22/35]
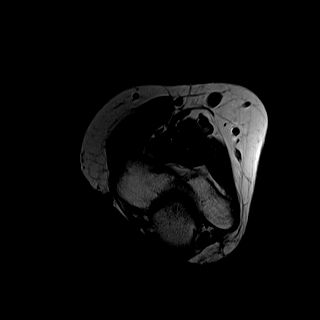
[im 26/35]
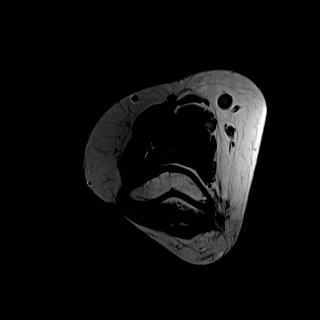
[im 30/35]
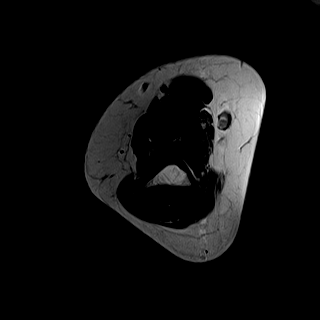
[im 35/35]
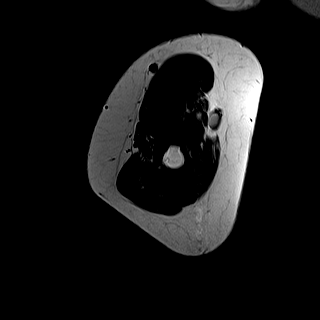

[Series 10: T2 fat-sat · axial · 4.0mm · 0.55mm/px · z∈[-56,+93]mm · 10 of 35 slices shown (1 of 2)]
[im 1/35]
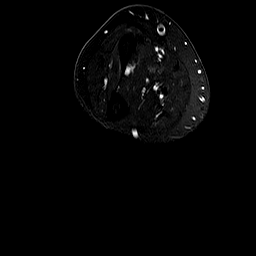
[im 4/35]
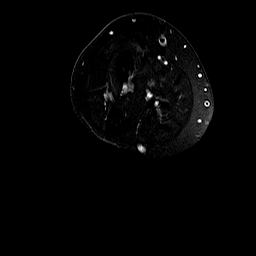
[im 8/35]
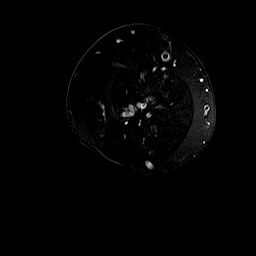
[im 12/35]
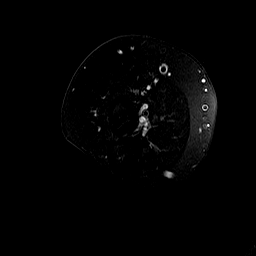
[im 16/35]
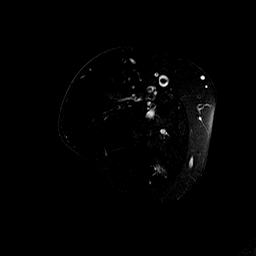
[im 19/35]
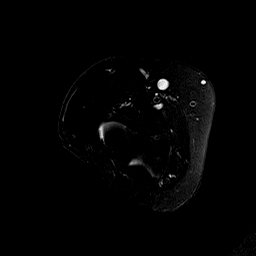
[im 23/35]
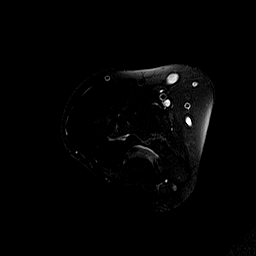
[im 27/35]
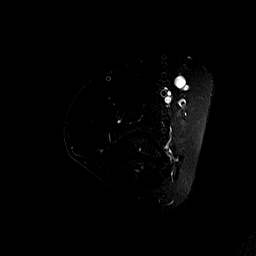
[im 31/35]
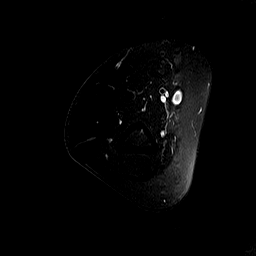
[im 35/35]
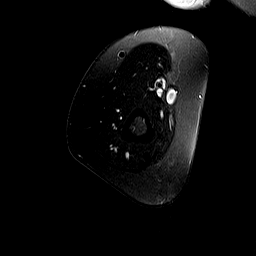

[Series 11: T1 · sagittal · 4.0mm · 0.50mm/px · 7 of 24 slices shown (2 of 2)]
[im 1/24]
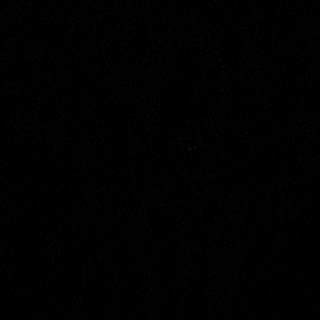
[im 4/24]
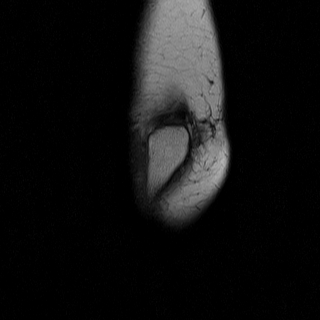
[im 8/24]
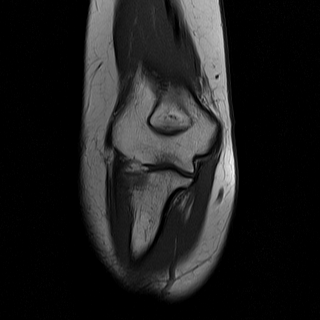
[im 12/24]
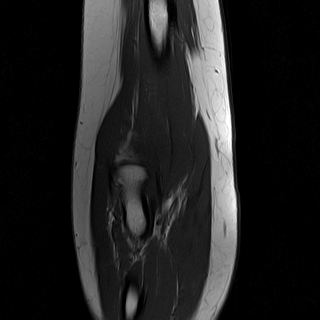
[im 16/24]
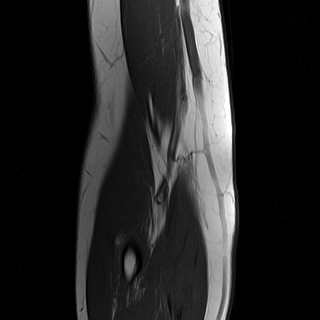
[im 20/24]
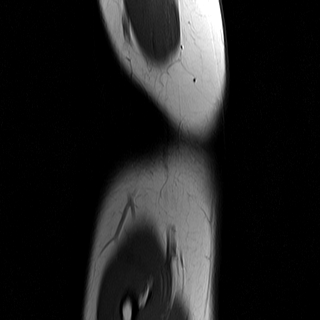
[im 24/24]
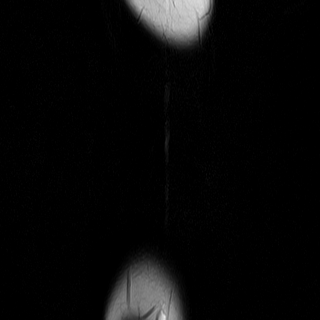

[Series 12: T2 fat-sat · sagittal · 4.0mm · 0.55mm/px · 7 of 24 slices shown (2 of 2)]
[im 1/24]
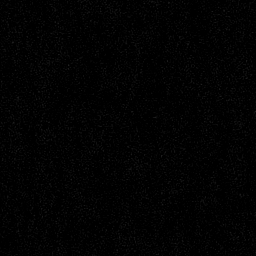
[im 4/24]
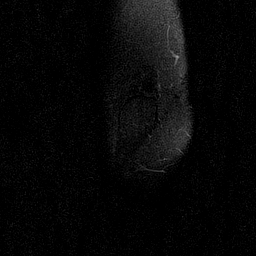
[im 8/24]
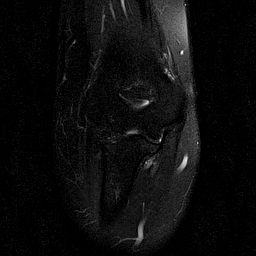
[im 12/24]
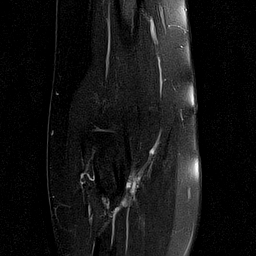
[im 16/24]
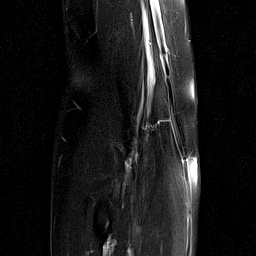
[im 20/24]
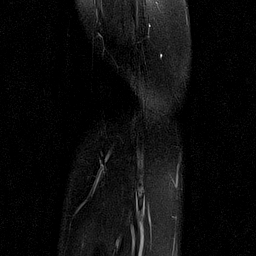
[im 24/24]
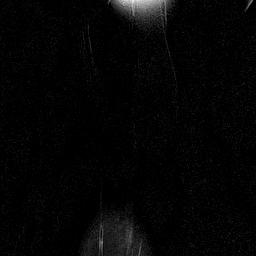

[Series 13: PD fat-sat · oblique · 3.0mm · 0.31mm/px · 7 of 27 slices shown]
[im 1/27]
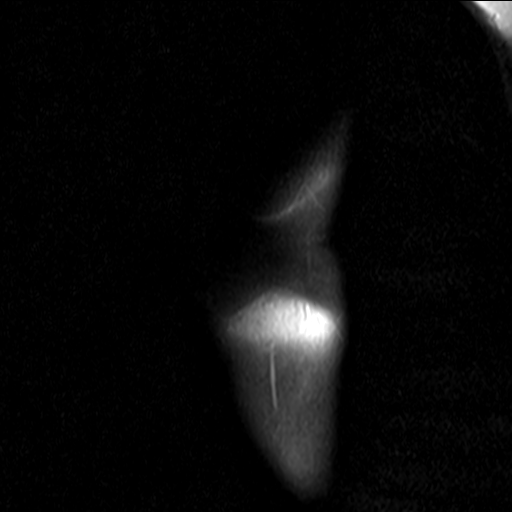
[im 5/27]
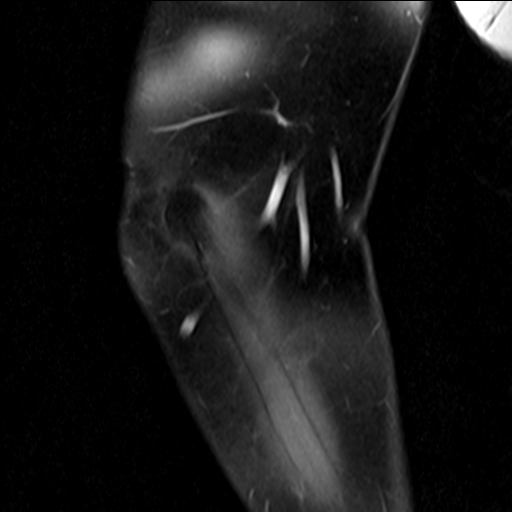
[im 9/27]
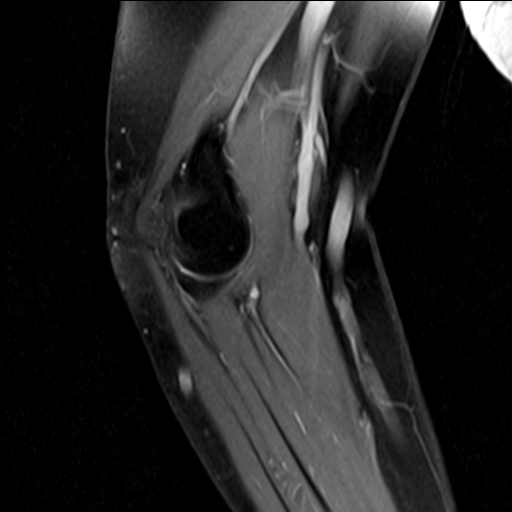
[im 14/27]
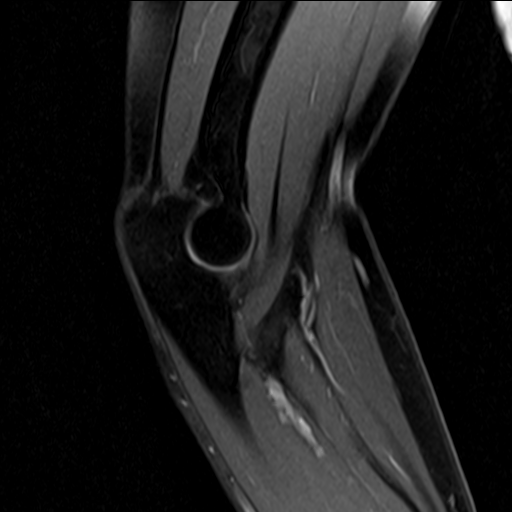
[im 18/27]
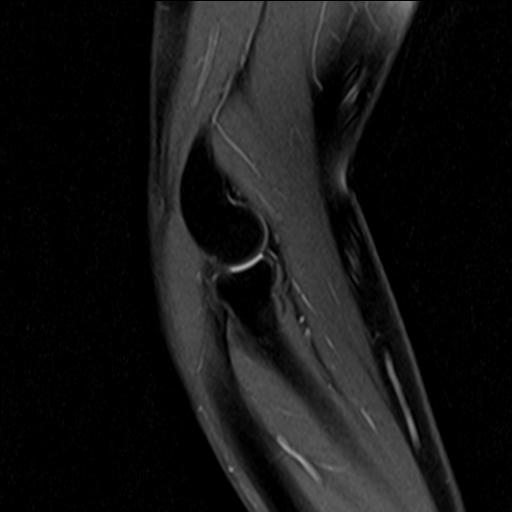
[im 22/27]
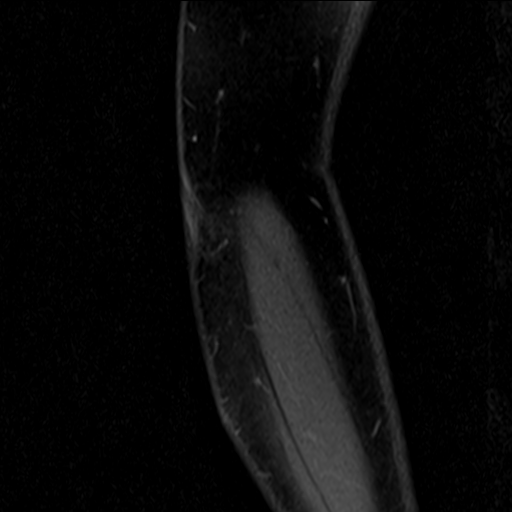
[im 27/27]
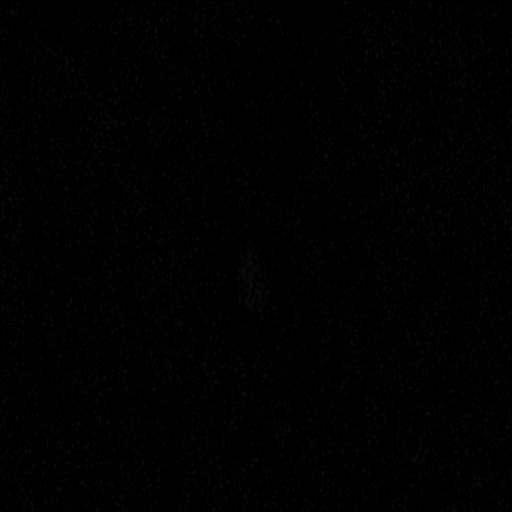

[40 of 40 positions shown; findings below may reference images not displayed]

FINDINGS: TENDONS

Common forearm flexor origin: Intact.

Common forearm extensor origin: Intact.

Biceps: Intact.

Triceps: Intact.

LIGAMENTS

Medial stabilizers: Intact.

Lateral stabilizers:  Intact.

Cartilage: No chondral defect.

Joint: No joint effusion.

Cubital tunnel: Mild scarring in the cubital tunnel from prior
surgery. Ulnar nerve is normal in size and signal and peripherally
located along the posterior periphery of the medial epicondyle.

Bones: No acute osseous abnormality.  No aggressive osseous lesion.

Soft tissue: Muscles are normal.  No muscle edema or muscle atrophy.
IMPRESSION: 1. Mild scarring in the cubital tunnel from prior surgery. Ulnar
nerve is normal in size and signal and peripherally located along
the posterior periphery of the medial epicondyle.

## 2019-08-31 ENCOUNTER — Other Ambulatory Visit: Payer: Self-pay

## 2019-08-31 ENCOUNTER — Ambulatory Visit (INDEPENDENT_AMBULATORY_CARE_PROVIDER_SITE_OTHER): Payer: Commercial Managed Care - PPO

## 2019-08-31 DIAGNOSIS — R29898 Other symptoms and signs involving the musculoskeletal system: Secondary | ICD-10-CM

## 2019-08-31 DIAGNOSIS — R202 Paresthesia of skin: Secondary | ICD-10-CM

## 2019-08-31 MED ORDER — GADOBUTROL 1 MMOL/ML IV SOLN
6.0000 mL | Freq: Once | INTRAVENOUS | Status: AC | PRN
  Administered 2019-08-31: 7.5 mL via INTRAVENOUS

## 2019-09-09 ENCOUNTER — Encounter: Payer: Self-pay | Admitting: Specialist

## 2019-09-09 ENCOUNTER — Ambulatory Visit: Payer: Commercial Managed Care - PPO | Admitting: Specialist

## 2019-09-09 ENCOUNTER — Other Ambulatory Visit: Payer: Self-pay

## 2019-09-09 ENCOUNTER — Ambulatory Visit: Payer: Self-pay

## 2019-09-09 VITALS — BP 133/86 | HR 75 | Ht 65.0 in | Wt 138.0 lb

## 2019-09-09 DIAGNOSIS — G5691 Unspecified mononeuropathy of right upper limb: Secondary | ICD-10-CM

## 2019-09-09 DIAGNOSIS — M5412 Radiculopathy, cervical region: Secondary | ICD-10-CM | POA: Diagnosis not present

## 2019-09-09 DIAGNOSIS — M7501 Adhesive capsulitis of right shoulder: Secondary | ICD-10-CM

## 2019-09-09 DIAGNOSIS — S86111A Strain of other muscle(s) and tendon(s) of posterior muscle group at lower leg level, right leg, initial encounter: Secondary | ICD-10-CM

## 2019-09-09 DIAGNOSIS — G5621 Lesion of ulnar nerve, right upper limb: Secondary | ICD-10-CM

## 2019-09-09 DIAGNOSIS — G5641 Causalgia of right upper limb: Secondary | ICD-10-CM | POA: Diagnosis not present

## 2019-09-09 DIAGNOSIS — R936 Abnormal findings on diagnostic imaging of limbs: Secondary | ICD-10-CM | POA: Diagnosis not present

## 2019-09-09 DIAGNOSIS — D1602 Benign neoplasm of scapula and long bones of left upper limb: Secondary | ICD-10-CM

## 2019-09-09 NOTE — Patient Instructions (Signed)
Avoid overhea  d lifting and overhead use of the arms. Do not lift greater than 5 lbs. Adjust head rest in vehicle to prevent hyperextension if rear ended. Take extra precautions to avoid falling, including use of a cane if you feel weak. Presently is not able to work due to persistent pain in the right arm and she is  Disabled from returning to her previous job duties. She is on long term disability and SSDI.

## 2019-09-09 NOTE — Progress Notes (Signed)
Office Visit Note   Patient: Marie Richardson           Date of Birth: 03-Apr-1961           MRN: [id number] Visit Date: 09/09/2019              Requested by: Wayland Salinas, MD [address] [place],  [place] [address] PCP: Wayland Salinas, MD   Assessment & Plan: Visit Diagnoses:  1. Abnormal MRI, shoulder   2. Ulnar neuropathy of right upper extremity   3. Radiculopathy, cervical region   4. Complex regional pain syndrome type 2 of right upper extremity   5. Neuropathy, arm, right   6. Adhesive capsulitis of right shoulder   7. Rupture of right posterior tibialis tendon, initial encounter   8. Ulnar neuropathy at wrist, right     Plan: Avoid overhead lifting and overhead use of the arms. Do not lift greater than 5 lbs. Adjust head rest in vehicle to prevent hyperextension if rear ended. Take extra precautions to avoid falling, including use of a cane if you feel weak. Presently is not able to work due to persistent pain in the right arm and she is  Disabled from returning to her previous job duties. She is on long term disability and SSDI.  Follow-Up Instructions: Return in about 4 weeks (around 10/07/2019).   Orders:  Orders Placed This Encounter  Procedures  . XR Shoulder Right   No orders of the defined types were placed in this encounter.     Procedures: No procedures performed   Clinical Data: Findings:  CLINICAL DATA:  59 year old female with unexplained right arm weakness, pain, numbness and tremor for 1 year.  EXAM: MRI HEAD WITHOUT AND WITH CONTRAST  TECHNIQUE: Multiplanar, multiecho pulse sequences of the brain and surrounding structures were obtained without and with intravenous contrast.  CONTRAST:  7.34mL GADAVIST GADOBUTROL 1 MMOL/ML IV SOLN  COMPARISON:  Cervical spine MRI 06/28/2019.  Brachial plexus MRI from today is reported separately.  FINDINGS: Brain: Cerebral volume is within normal limits. No  restricted diffusion to suggest acute infarction. No midline shift, mass effect, evidence of mass lesion, ventriculomegaly, extra-axial collection or acute intracranial hemorrhage. Cervicomedullary junction and pituitary are within normal limits.  Pearline Cables and white matter signal is within normal limits for age throughout the brain. No cortical encephalomalacia or chronic cerebral blood products identified. The deep gray nuclei, brainstem and cerebellum appear normal.  No abnormal enhancement identified. No dural thickening.  Vascular: Major intracranial vascular flow voids are preserved. The right vertebral artery appears somewhat dominant. The major dural venous sinuses are enhancing and appear to be patent.  Skull and upper cervical spine: Partially visible cervical ACDF, otherwise negative visible cervical spine. Normal bone marrow signal.  Sinuses/Orbits: Negative orbits. Paranasal sinuses and mastoids are well pneumatized.  Other: Grossly normal visible internal auditory structures. Scalp and face soft tissues appear negative.  IMPRESSION: Normal for age MRI appearance of the brain.   Electronically Signed   By: Genevie Ann M.D.   On: 08/31/2019 20:00    Subjective: Chief Complaint  Patient presents with  . Right Arm - Follow-up    HPI  Review of Systems   Objective: Vital Signs: BP 133/86   Pulse 75   Ht 5\' 5"  (1.651 m)   Wt 138 lb (62.6 kg)   BMI 22.96 kg/m   Physical Exam  Ortho Exam  Specialty Comments:  No specialty comments available.  Imaging: No  results found.   PMFS History: Patient Active Problem List   Diagnosis Date Noted  . Other spondylosis with radiculopathy, cervical region 03/26/2017    Priority: High    Class: Temporary  . S/P cervical spinal fusion 06/20/2018  . Status post laminectomy 03/26/2017   Past Medical History:  Diagnosis Date  . Arthritis    neck  . Asthma   . Complication of anesthesia    pt told  she should always have a "pediatric intubation tube"  . 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 Endometriosis   . High cholesterol   . Pneumonia   . PONV (postoperative nausea and vomiting)     Family History  Problem Relation Age of Onset  . Heart attack Mother   . Arthritis Mother   . High Cholesterol Mother   . High Cholesterol Father   . Hypertension Father   . Heart attack Father     Past Surgical History:  Procedure Laterality Date  . ABDOMINAL HYSTERECTOMY  2000  . ANTERIOR CERVICAL DECOMP/DISCECTOMY FUSION N/A 06/20/2018   Procedure: ANTERIOR CERVICAL DISCECTOMY AND FUSION C4-5 AND C5-6 WITH PLATES, SCREWS, ALLOGRAFT BONE GRAFT AND LOCAL BONE GRAFT;  Surgeon: Jessy Oto, MD;  Location: [facility];  Service: Orthopedics;  Laterality: N/A;  . APPENDECTOMY     secondary to endometriosis  . CARPAL TUNNEL RELEASE Right   . COLONOSCOPY    . LAPAROSCOPIC ENDOMETRIOSIS FULGURATION     pt had 7 surgeries related to endometriosis  . POSTERIOR CERVICAL FUSION/FORAMINOTOMY N/A 03/26/2017   Procedure: Right C5-6 and C6-7 Foraminotomies;  Surgeon: Jessy Oto, MD;  Location: [facility];  Service: Orthopedics;  Laterality: N/A;  . ULNAR TUNNEL RELEASE Right 09/19/2017   Social History   Occupational History  . Not on file  Tobacco Use  . Smoking status: Never Smoker  . Smokeless tobacco: Never Used  Substance and Sexual Activity  . Alcohol use: Yes    Comment: very rarely  . Drug use: No  . Sexual activity: Not on file

## 2019-11-21 ENCOUNTER — Other Ambulatory Visit: Payer: Self-pay | Admitting: Specialist

## 2019-12-09 ENCOUNTER — Encounter: Payer: Self-pay | Admitting: Specialist

## 2019-12-09 ENCOUNTER — Other Ambulatory Visit: Payer: Self-pay

## 2019-12-09 ENCOUNTER — Ambulatory Visit (INDEPENDENT_AMBULATORY_CARE_PROVIDER_SITE_OTHER): Payer: Commercial Managed Care - PPO

## 2019-12-09 ENCOUNTER — Ambulatory Visit: Payer: Commercial Managed Care - PPO | Admitting: Specialist

## 2019-12-09 VITALS — BP 129/83 | HR 86 | Ht 65.0 in | Wt 138.0 lb

## 2019-12-09 DIAGNOSIS — M25511 Pain in right shoulder: Secondary | ICD-10-CM | POA: Diagnosis not present

## 2019-12-09 DIAGNOSIS — G8929 Other chronic pain: Secondary | ICD-10-CM

## 2019-12-09 DIAGNOSIS — M25512 Pain in left shoulder: Secondary | ICD-10-CM

## 2019-12-09 DIAGNOSIS — D1602 Benign neoplasm of scapula and long bones of left upper limb: Secondary | ICD-10-CM

## 2019-12-09 DIAGNOSIS — M7501 Adhesive capsulitis of right shoulder: Secondary | ICD-10-CM

## 2019-12-09 NOTE — Progress Notes (Signed)
Office Visit Note   Patient: Marie Richardson           Date of Birth: 16-Oct-1960           MRN: [id number] Visit Date: 12/09/2019              Requested by: Wayland Salinas, MD [address] [place],  [place] [address] PCP: Wayland Salinas, MD   Assessment & Plan: Visit Diagnoses:  1. Chronic pain of both shoulders   2. Adhesive capsulitis of right shoulder   3. Enchondroma of humerus, left     Plan: Avoid overhead lifting and overhead use of the arms. Pillows to keep from sleeping directly on the shoulders Limited lifting to less than 10 lbs. Ice or heat for relief. NSAIDs are helpful, such as alleve or motrin, be careful not to use in excess as they place burdens on the kidney. Stretching exercise help and strengthening is helpful to build endurance.  Follow-Up Instructions: Return in about 4 weeks (around 01/06/2020).   Orders:  Orders Placed This Encounter  Procedures  . XR Shoulder Left  . Ambulatory referral to Physical Therapy   No orders of the defined types were placed in this encounter.     Procedures: No procedures performed   Clinical Data: Findings:  Study Result  CLINICAL DATA:  Right arm weakness with pain and tremors and numbness for 1 year.  EXAM: MRI CHEST WITHOUT AND WITH CONTRAST  TECHNIQUE: Multiplanar multi echo sequences were performed before and after contrast administration.  CONTRAST:  6.0 mL GADAVIST GADOBUTROL 1 MMOL/ML IV SOLN  COMPARISON:  Cervical radiographs dated 09/19/2018  FINDINGS: The brachial plexus is well visualized and appears normal. There is no pathologic enhancement in or along the brachial plexus through the axilla. No adenopathy or mass lesion.  There are no cervical ribs.  After contrast administration there is asymmetric enhancement of the synovium of the right glenohumeral joint as compared to the left. This is consistent with synovitis of the glenohumeral  joint.  Incidental note is made of an incompletely visualized lesion in the left humeral head. I recommend radiographs of the left shoulder for further characterization. The lesion does enhance after contrast administration.  IMPRESSION: 1. Normal right brachial plexus through the axilla. 2. Right glenohumeral joint synovitis. 3. Incompletely visualized 2 cm lesion in the left humeral head. I recommend radiographs of the left shoulder for further characterization.   Electronically Signed   By: Lorriane Shire M.D.   On: 09/01/2019 08:11      Subjective: Chief Complaint  Patient presents with  . Right Shoulder - Follow-up, Pain  . Right Arm - Follow-up, Pain  . 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 Seen by Dr. Modena Nunnery at [facility] and has been told that she has a thoracic outlet syndrome. She uses a right wrist splint and is not in PT. She has night pain and AM pain and stiffness. Was evaluated for right shoulder pain and felt to have a right shoulder adhesive capsulitis. Saw Dr. Marlou Sa and had  An intraarticular injection with pain recurrence and still experiencing right arm stiffness.  Left arm is painful in the left shoulder laterally, she feels like it is pain like being hit with a  Baseball bat. She is concerned about an enchondroma seen on last  visit's radiograph of the left shoulder.    Review of Systems  Constitutional: Negative.   HENT: Negative.   Eyes: Negative.   Respiratory: Negative.   Cardiovascular: Negative.   Gastrointestinal: Negative.  Negative for diarrhea, nausea and vomiting.  Endocrine: Negative.   Genitourinary: Negative.   Musculoskeletal: Positive for neck pain and neck stiffness.  Skin: Negative.  Negative for color change, pallor, rash and wound.  Allergic/Immunologic: Negative.    Neurological: Positive for weakness and numbness.  Hematological: Negative.   Psychiatric/Behavioral: Negative.      Objective: Vital Signs: BP 129/83 (BP Location: Left Arm, Patient Position: Sitting)   Pulse 86   Ht 5\' 5"  (1.651 m)   Wt 138 lb (62.6 kg)   BMI 22.96 kg/m   Physical Exam  Right Hand Exam   Tenderness  The patient is experiencing tenderness in the palmar area.      Specialty Comments:  No specialty comments available.  Imaging: No results found.   PMFS History: Patient Active Problem List   Diagnosis Date Noted  . Other spondylosis with radiculopathy, cervical region 03/26/2017    Priority: High    Class: Temporary  . S/P cervical spinal fusion 06/20/2018  . Status post laminectomy 03/26/2017   Past Medical History:  Diagnosis Date  . Arthritis    neck  . Asthma   . Complication of anesthesia    pt told she should always have a "pediatric intubation tube"  . 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 Endometriosis   . High cholesterol   . Pneumonia   . PONV (postoperative nausea and vomiting)     Family History  Problem Relation Age of Onset  . Heart attack Mother   . Arthritis Mother   . High Cholesterol Mother   . High Cholesterol Father   . Hypertension Father   . Heart attack Father     Past Surgical History:  Procedure Laterality Date  . ABDOMINAL HYSTERECTOMY  2000  . ANTERIOR CERVICAL DECOMP/DISCECTOMY FUSION N/A 06/20/2018   Procedure: ANTERIOR CERVICAL DISCECTOMY AND FUSION C4-5 AND C5-6 WITH PLATES, SCREWS, ALLOGRAFT BONE GRAFT AND LOCAL BONE GRAFT;  Surgeon: Jessy Oto, MD;  Location: [facility];  Service: Orthopedics;  Laterality: N/A;  . APPENDECTOMY     secondary to endometriosis  . CARPAL TUNNEL RELEASE Right   . COLONOSCOPY    . LAPAROSCOPIC ENDOMETRIOSIS FULGURATION     pt had 7 surgeries related to endometriosis  . POSTERIOR CERVICAL FUSION/FORAMINOTOMY N/A 03/26/2017   Procedure: Right C5-6  and C6-7 Foraminotomies;  Surgeon: Jessy Oto, MD;  Location: [facility];  Service: Orthopedics;  Laterality: N/A;  . ULNAR TUNNEL RELEASE Right 09/19/2017   Social History   Occupational History  . Not on file  Tobacco Use  . Smoking status: Never Smoker  . Smokeless tobacco: Never Used  Vaping Use  . Vaping Use: Never used  Substance and Sexual Activity  . Alcohol use: Yes    Comment: very rarely  . Drug use: No  . Sexual activity: Not on file

## 2019-12-09 NOTE — Patient Instructions (Signed)
Avoid overhead lifting and overhead use of the arms. Pillows to keep from sleeping directly on the shoulders Limited lifting to less than 10 lbs. Ice or heat for relief. NSAIDs are helpful, such as alleve or motrin, be careful not to use in excess as they place burdens on the kidney. Stretching exercise help and strengthening is helpful to build endurance.

## 2019-12-24 ENCOUNTER — Encounter: Payer: Self-pay | Admitting: Specialist

## 2020-01-07 ENCOUNTER — Ambulatory Visit: Payer: Commercial Managed Care - PPO | Admitting: Orthopedic Surgery

## 2020-01-11 ENCOUNTER — Ambulatory Visit: Payer: Commercial Managed Care - PPO | Admitting: Orthopedic Surgery

## 2020-01-18 ENCOUNTER — Encounter: Payer: Self-pay | Admitting: Orthopedic Surgery

## 2020-01-18 ENCOUNTER — Ambulatory Visit: Payer: Commercial Managed Care - PPO | Admitting: Orthopedic Surgery

## 2020-01-18 VITALS — Ht 65.0 in | Wt 138.0 lb

## 2020-01-18 DIAGNOSIS — M7501 Adhesive capsulitis of right shoulder: Secondary | ICD-10-CM

## 2020-01-21 ENCOUNTER — Encounter: Payer: Self-pay | Admitting: Orthopedic Surgery

## 2020-01-21 NOTE — Progress Notes (Signed)
Office Visit Note   Patient: Marie Richardson           Date of Birth: 1960-08-31           MRN: [id number] Visit Date: 01/18/2020 Requested by: Wayland Salinas, MD [address],  [place] [address] PCP: Wayland Salinas, MD  Subjective: Chief Complaint  Patient presents with  . Right Shoulder - Pain    HPI: Marie Richardson is a 59 year old patient with right shoulder pain.  She does have right shoulder adhesive capsulitis.  Reports continued pain.  Shoulder pain is gotten little bit worse.  Hard for her to do the back of her hair anymore.  Takes ibuprofen hydrocodone Robaxin and gabapentin as needed.  She describes some upper trapezial pain on the right-hand side.  Most days she has pain in the shoulder as well as some pain which radiates down her arm.  She has some neck issues as well which are being looked at by our neck surgeon.  Overall her shoulder pain and stiffness has worsened.              ROS: All systems reviewed are negative as they relate to the chief complaint within the history of present illness.  Patient denies  fevers or chills.   Assessment & Plan: Visit Diagnoses:  1. Adhesive capsulitis of right shoulder     Plan: Impression is static slightly worsening adhesive capsulitis without improvement after 2 intra-articular injections.  Decision point for [place] today is for or against surgical intervention which would be manipulation under anesthesia with rotator interval release along with extensive use of this postop CPM machine for the first week after surgery.  Discussed risk and benefits of surgical intervention versus allowing the natural history of this to play out.  She is going to consider her options and let us know if she wants to proceed.  Follow-up with [place] as needed  Follow-Up Instructions: Return if symptoms worsen or fail to improve.   Orders:  No orders of the defined types were placed in this encounter.  No orders of the  defined types were placed in this encounter.     Procedures: No procedures performed   Clinical Data: No additional findings.  Objective: Vital Signs: Ht 5\' 5"  (1.651 m)   Wt 138 lb (62.6 kg)   BMI 22.96 kg/m   Physical Exam:   Constitutional: Patient appears well-developed HEENT:  Head: Normocephalic Eyes:EOM are normal Neck: Normal range of motion Cardiovascular: Normal rate Pulmonary/chest: Effort normal Neurologic: Patient is alert Skin: Skin is warm Psychiatric: Patient has normal mood and affect    Ortho Exam: Ortho exam demonstrates pretty good rotator cuff strength infraspinatus supraspinatus and subscap muscle testing.  She does have limitation of external rotation 15 degrees of abduction to about 20 degrees.  Deltoid is functional.  Isolated glenohumeral forward flexion is right around 90.  Ice that again humeral abduction is around 80.  No definite paresthesias in the shoulder girdle region.  Specialty Comments:  No specialty comments available.  Imaging: No results found.   PMFS History: Patient Active Problem List   Diagnosis Date Noted  . S/P cervical spinal fusion 06/20/2018  . Status post laminectomy 03/26/2017  . Other spondylosis with radiculopathy, cervical region 03/26/2017    Class: Temporary   Past Medical History:  Diagnosis Date  . Arthritis    neck  . Asthma   . Complication of anesthesia    pt told she should always  have a "pediatric intubation tube"  . 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 Endometriosis   . High cholesterol   . Pneumonia   . PONV (postoperative nausea and vomiting)     Family History  Problem Relation Age of Onset  . Heart attack Mother   . Arthritis Mother   . High Cholesterol Mother   . High Cholesterol Father   . Hypertension Father   . Heart attack Father     Past Surgical History:  Procedure Laterality Date  . ABDOMINAL HYSTERECTOMY  2000  . ANTERIOR CERVICAL  DECOMP/DISCECTOMY FUSION N/A 06/20/2018   Procedure: ANTERIOR CERVICAL DISCECTOMY AND FUSION C4-5 AND C5-6 WITH PLATES, SCREWS, ALLOGRAFT BONE GRAFT AND LOCAL BONE GRAFT;  Surgeon: Jessy Oto, MD;  Location: [facility];  Service: Orthopedics;  Laterality: N/A;  . APPENDECTOMY     secondary to endometriosis  . CARPAL TUNNEL RELEASE Right   . COLONOSCOPY    . LAPAROSCOPIC ENDOMETRIOSIS FULGURATION     pt had 7 surgeries related to endometriosis  . POSTERIOR CERVICAL FUSION/FORAMINOTOMY N/A 03/26/2017   Procedure: Right C5-6 and C6-7 Foraminotomies;  Surgeon: Jessy Oto, MD;  Location: [facility];  Service: Orthopedics;  Laterality: N/A;  . ULNAR TUNNEL RELEASE Right 09/19/2017   Social History   Occupational History  . Not on file  Tobacco Use  . Smoking status: Never Smoker  . Smokeless tobacco: Never Used  Vaping Use  . Vaping Use: Never used  Substance and Sexual Activity  . Alcohol use: Yes    Comment: very rarely  . Drug use: No  . Sexual activity: Not on file

## 2020-01-22 ENCOUNTER — Other Ambulatory Visit: Payer: Self-pay | Admitting: Orthopedic Surgery

## 2020-01-22 DIAGNOSIS — M25812 Other specified joint disorders, left shoulder: Secondary | ICD-10-CM

## 2020-02-01 ENCOUNTER — Other Ambulatory Visit: Payer: Self-pay

## 2020-02-01 ENCOUNTER — Ambulatory Visit: Payer: Commercial Managed Care - PPO

## 2020-02-06 ENCOUNTER — Other Ambulatory Visit: Payer: Self-pay

## 2020-02-06 ENCOUNTER — Ambulatory Visit (HOSPITAL_BASED_OUTPATIENT_CLINIC_OR_DEPARTMENT_OTHER)
Admission: RE | Admit: 2020-02-06 | Discharge: 2020-02-06 | Disposition: A | Discharge status: Home / Self Care | Payer: Commercial Managed Care - PPO | Source: Ambulatory Visit | Attending: Orthopedic Surgery | Admitting: Orthopedic Surgery

## 2020-02-06 DIAGNOSIS — M25812 Other specified joint disorders, left shoulder: Secondary | ICD-10-CM | POA: Diagnosis not present

## 2020-02-06 MED ORDER — GADOBUTROL 1 MMOL/ML IV SOLN
6.2000 mL | Freq: Once | INTRAVENOUS | Status: AC | PRN
  Administered 2020-02-06: 6.2 mL via INTRAVENOUS

## 2020-02-15 ENCOUNTER — Telehealth: Payer: Self-pay | Admitting: Surgical

## 2020-02-15 ENCOUNTER — Other Ambulatory Visit: Payer: Self-pay | Admitting: Surgical

## 2020-02-15 ENCOUNTER — Encounter: Payer: Self-pay | Admitting: Orthopedic Surgery

## 2020-02-15 DIAGNOSIS — M7501 Adhesive capsulitis of right shoulder: Secondary | ICD-10-CM

## 2020-02-15 MED ORDER — MELOXICAM 15 MG PO TABS: 15.0000 mg | ORAL_TABLET | Freq: Every day | ORAL | 0 refills | Status: AC

## 2020-02-15 MED ORDER — METHOCARBAMOL 500 MG PO TABS: ORAL_TABLET | ORAL | 0 refills | Status: AC

## 2020-02-15 MED ORDER — HYDROCODONE-ACETAMINOPHEN 7.5-325 MG PO TABS: 1.0000 | ORAL_TABLET | ORAL | 0 refills | Status: AC | PRN

## 2020-02-15 NOTE — Telephone Encounter (Signed)
See below

## 2020-02-15 NOTE — Telephone Encounter (Signed)
Marie Richardson from [facility] called to verify hydrocodone medication. Please call Marie Richardson to verify. Prescription wrote by PA [facility]. [place] phone number is [phone].

## 2020-02-17 ENCOUNTER — Telehealth: Payer: Self-pay

## 2020-02-17 NOTE — Telephone Encounter (Signed)
Pls advise thanks.  

## 2020-02-17 NOTE — Telephone Encounter (Signed)
Patient called in wanting instructions on taking her bandages

## 2020-02-18 ENCOUNTER — Telehealth: Payer: Self-pay | Admitting: Orthopedic Surgery

## 2020-02-18 ENCOUNTER — Encounter: Payer: Self-pay | Admitting: Orthopedic Surgery

## 2020-02-18 NOTE — Telephone Encounter (Signed)
See below. Patient returning your call

## 2020-02-18 NOTE — Telephone Encounter (Signed)
Pt called stating she missed a call and would like to be tried again.   [phone]

## 2020-02-18 NOTE — Telephone Encounter (Signed)
Called just now. Sent to voicemail.  Will try again

## 2020-02-18 NOTE — Telephone Encounter (Signed)
Called and spoke with her.  One question I couldn't answer, will ask Dr Marlou Sa tomorrow and call back, please remind, thanks!

## 2020-02-19 NOTE — Telephone Encounter (Signed)
I called.  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  We will have to evaluate the stability of the ulnar nerve on her return visit.

## 2020-02-24 ENCOUNTER — Ambulatory Visit (INDEPENDENT_AMBULATORY_CARE_PROVIDER_SITE_OTHER): Payer: Commercial Managed Care - PPO | Admitting: Orthopedic Surgery

## 2020-02-24 ENCOUNTER — Encounter: Payer: Self-pay | Admitting: Orthopedic Surgery

## 2020-02-24 DIAGNOSIS — M7501 Adhesive capsulitis of right shoulder: Secondary | ICD-10-CM

## 2020-02-26 ENCOUNTER — Encounter: Payer: Self-pay | Admitting: Orthopedic Surgery

## 2020-02-27 ENCOUNTER — Encounter: Payer: Self-pay | Admitting: Orthopedic Surgery

## 2020-02-27 NOTE — Progress Notes (Signed)
   Post-Op Visit Note   Patient: Marie Richardson           Date of Birth: 1961-04-09           MRN: [id number] Visit Date: 02/24/2020 PCP: Wayland Salinas, MD   Assessment & Plan:  Chief Complaint:  Chief Complaint  Patient presents with  . Right Shoulder - Routine Post Op   Visit Diagnoses:  1. Adhesive capsulitis of right shoulder     Plan: Yearly is now a week out right shoulder manipulation under anesthesia frozen shoulder and rotator interval release.  She is doing CPM machine.  Having some issues with her ulnar nerve around the elbow.  She has near subluxation on exam but the nerve does not actually come over the medial epicondyle.  She is on an anti-inflammatory.  On exam today she has got forward flexion and abduction approaching 90.  Overall she is making progress.  I do want her to do continue with the CPM and continue with physical therapy in order to do some manual therapy.  Come back in 3 weeks for recheck.  Follow-Up Instructions: No follow-ups on file.   Orders:  No orders of the defined types were placed in this encounter.  No orders of the defined types were placed in this encounter.   Imaging: No results found.  PMFS History: Patient Active Problem List   Diagnosis Date Noted  . S/P cervical spinal fusion 06/20/2018  . Status post laminectomy 03/26/2017  . Other spondylosis with radiculopathy, cervical region 03/26/2017    Class: Temporary   Past Medical History:  Diagnosis Date  . Arthritis    neck  . Asthma   . Complication of anesthesia    pt told she should always have a "pediatric intubation tube"  . 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 Endometriosis   . High cholesterol   . Pneumonia   . PONV (postoperative nausea and vomiting)     Family History  Problem Relation Age of Onset  . Heart attack Mother   . Arthritis Mother   . High Cholesterol Mother   . High Cholesterol Father   . Hypertension Father   .  Heart attack Father     Past Surgical History:  Procedure Laterality Date  . ABDOMINAL HYSTERECTOMY  2000  . ANTERIOR CERVICAL DECOMP/DISCECTOMY FUSION N/A 06/20/2018   Procedure: ANTERIOR CERVICAL DISCECTOMY AND FUSION C4-5 AND C5-6 WITH PLATES, SCREWS, ALLOGRAFT BONE GRAFT AND LOCAL BONE GRAFT;  Surgeon: Jessy Oto, MD;  Location: [facility];  Service: Orthopedics;  Laterality: N/A;  . APPENDECTOMY     secondary to endometriosis  . CARPAL TUNNEL RELEASE Right   . COLONOSCOPY    . LAPAROSCOPIC ENDOMETRIOSIS FULGURATION     pt had 7 surgeries related to endometriosis  . POSTERIOR CERVICAL FUSION/FORAMINOTOMY N/A 03/26/2017   Procedure: Right C5-6 and C6-7 Foraminotomies;  Surgeon: Jessy Oto, MD;  Location: [facility];  Service: Orthopedics;  Laterality: N/A;  . ULNAR TUNNEL RELEASE Right 09/19/2017   Social History   Occupational History  . Not on file  Tobacco Use  . Smoking status: Never Smoker  . Smokeless tobacco: Never Used  Vaping Use  . Vaping Use: Never used  Substance and Sexual Activity  . Alcohol use: Yes    Comment: very rarely  . Drug use: No  . Sexual activity: Not on file

## 2020-03-03 ENCOUNTER — Telehealth: Payer: Self-pay | Admitting: Orthopedic Surgery

## 2020-03-03 NOTE — Telephone Encounter (Signed)
I spoke with patient and advised per below. Ended up working patient in tomorrow to see Dr Dean/Luke

## 2020-03-03 NOTE — Telephone Encounter (Signed)
Patient called requesting a call back. Patient states she sent message thru MyChart for Dr. Marlou Sa a couple days ago and really need to speak with him or his nurse. Patient states she had surgery 02/24/20 and physical therapy session is making her upper right shoulder hurt worse. Plesase call patient concerning this issue at [phone].

## 2020-03-03 NOTE — Telephone Encounter (Signed)
Okay to DC therapy for a week if it is making it more painful.  Alternatively we could try a different location of therapy.  In general however I do want her to move the shoulder is much as possible.  The movement is the key.  However if therapy is hurting her then she is okay to do some home exercises but I do want her to keep the shoulder moving.

## 2020-03-03 NOTE — Telephone Encounter (Signed)
See below. Please advise. Thanks.  

## 2020-03-04 ENCOUNTER — Telehealth: Payer: Self-pay | Admitting: Orthopedic Surgery

## 2020-03-04 ENCOUNTER — Ambulatory Visit (INDEPENDENT_AMBULATORY_CARE_PROVIDER_SITE_OTHER): Payer: Commercial Managed Care - PPO | Admitting: Orthopedic Surgery

## 2020-03-04 DIAGNOSIS — M7501 Adhesive capsulitis of right shoulder: Secondary | ICD-10-CM

## 2020-03-04 MED ORDER — CELECOXIB 100 MG PO CAPS
100.0000 mg | ORAL_CAPSULE | Freq: Two times a day (BID) | ORAL | 0 refills | Status: DC
Start: 2020-03-04 — End: 2020-09-28

## 2020-03-04 NOTE — Telephone Encounter (Signed)
faxed

## 2020-03-04 NOTE — Telephone Encounter (Signed)
Nessha from [facility] called requesting Dr. Marlou Sa sign orders for patient to switch from physical therapy to occupational therapy. Please fax orders to [phone]. Nessha phone number is [phone]. If any questions or concerns leave VM on secure line.

## 2020-03-06 ENCOUNTER — Encounter: Payer: Self-pay | Admitting: Orthopedic Surgery

## 2020-03-06 NOTE — Progress Notes (Signed)
Post-Op Visit Note   Patient: Marie Richardson           Date of Birth: 1960/10/30           MRN: [id number] Visit Date: 03/04/2020 PCP: Marie Salinas, MD   Assessment & Plan:  Chief Complaint:  Chief Complaint  Patient presents with   Shoulder Pain   Visit Diagnoses:  1. Adhesive capsulitis of right shoulder     Plan: Yanelis is now several weeks out from right shoulder manipulation under anesthesia and rotator interval release for adhesive capsulitis.  Having some pain and also having some arm pain and hand pain.  Has had prior ulnar nerve surgery.  She is on Mobic which hurts her stomach.  Also takes gabapentin.  She is spending time in the CPM machine.  On examination her flexion is still over 90 degrees and isolated glenohumeral abduction is about 80.  External rotation is about 40 degrees.  All of these numbers are slightly less than she was in her prior clinic visit.  Plan is to continue going to therapy for manual therapy continue using the machine is much as possible where it does not hurt her arm.  Minna change over from Mobic to Celebrex and 6-week return.  She may need further work-up on the elbow if that remains symptomatic.  Follow-Up Instructions: Return in about 6 weeks (around 04/15/2020).   Orders:  No orders of the defined types were placed in this encounter.  Meds ordered this encounter  Medications   celecoxib (CELEBREX) 100 MG capsule    Sig: Take 1 capsule (100 mg total) by mouth 2 (two) times daily.    Dispense:  60 capsule    Refill:  0    Imaging: No results found.  PMFS History: Patient Active Problem List   Diagnosis Date Noted   S/P cervical spinal fusion 06/20/2018   Status post laminectomy 03/26/2017   Other spondylosis with radiculopathy, cervical region 03/26/2017    Class: Temporary   Past Medical History:  Diagnosis Date   Arthritis    neck   Asthma    Complication of anesthesia    pt told she should always have  a "pediatric intubation tube"   Contact with and (suspected) exposure to mold (toxic)    Endometriosis    High cholesterol    Pneumonia    PONV (postoperative nausea and vomiting)     Family History  Problem Relation Age of Onset   Heart attack Mother    Arthritis Mother    High Cholesterol Mother    High Cholesterol Father    Hypertension Father    Heart attack Father     Past Surgical History:  Procedure Laterality Date   ABDOMINAL HYSTERECTOMY  2000   ANTERIOR CERVICAL DECOMP/DISCECTOMY FUSION N/A 06/20/2018   Procedure: ANTERIOR CERVICAL DISCECTOMY AND FUSION C4-5 AND C5-6 WITH PLATES, SCREWS, ALLOGRAFT BONE GRAFT AND LOCAL BONE GRAFT;  Surgeon: Jessy Oto, MD;  Location: [facility];  Service: Orthopedics;  Laterality: N/A;   APPENDECTOMY     secondary to endometriosis   CARPAL TUNNEL RELEASE Right    COLONOSCOPY     LAPAROSCOPIC ENDOMETRIOSIS FULGURATION     pt had 7 surgeries related to endometriosis   POSTERIOR CERVICAL FUSION/FORAMINOTOMY N/A 03/26/2017   Procedure: Right C5-6 and C6-7 Foraminotomies;  Surgeon: Jessy Oto, MD;  Location: [facility];  Service: Orthopedics;  Laterality: N/A;   ULNAR TUNNEL RELEASE Right 09/19/2017   Social History  Occupational History   Not on file  Tobacco Use   Smoking status: Never Smoker   Smokeless tobacco: Never Used  Vaping Use   Vaping Use: Never used  Substance and Sexual Activity   Alcohol use: Yes    Comment: very rarely   Drug use: No   Sexual activity: Not on file

## 2020-03-10 ENCOUNTER — Encounter: Payer: Self-pay | Admitting: Orthopedic Surgery

## 2020-03-16 ENCOUNTER — Encounter: Payer: Self-pay | Admitting: Orthopedic Surgery

## 2020-03-16 ENCOUNTER — Ambulatory Visit: Payer: Commercial Managed Care - PPO | Admitting: Orthopedic Surgery

## 2020-03-22 ENCOUNTER — Encounter: Payer: Self-pay | Admitting: Orthopedic Surgery

## 2020-03-22 NOTE — Telephone Encounter (Signed)
This is hand surgery territory ok to come in so we can do referral but she may have to go back to the [place] hand surgeon

## 2020-03-25 ENCOUNTER — Other Ambulatory Visit: Payer: Self-pay | Admitting: Specialist

## 2020-03-25 DIAGNOSIS — M25521 Pain in right elbow: Secondary | ICD-10-CM

## 2020-03-25 DIAGNOSIS — G5621 Lesion of ulnar nerve, right upper limb: Secondary | ICD-10-CM

## 2020-04-05 ENCOUNTER — Other Ambulatory Visit: Payer: Self-pay | Admitting: Specialist

## 2020-04-05 DIAGNOSIS — M5412 Radiculopathy, cervical region: Secondary | ICD-10-CM

## 2020-04-15 ENCOUNTER — Ambulatory Visit (INDEPENDENT_AMBULATORY_CARE_PROVIDER_SITE_OTHER): Payer: Commercial Managed Care - PPO | Admitting: Surgical

## 2020-04-15 DIAGNOSIS — M25521 Pain in right elbow: Secondary | ICD-10-CM | POA: Diagnosis not present

## 2020-04-15 DIAGNOSIS — G5621 Lesion of ulnar nerve, right upper limb: Secondary | ICD-10-CM

## 2020-04-19 ENCOUNTER — Telehealth: Payer: Self-pay | Admitting: Orthopedic Surgery

## 2020-04-19 NOTE — Telephone Encounter (Signed)
Please advise. Thanks.  

## 2020-04-19 NOTE — Telephone Encounter (Signed)
Pt called stating she discussed possibly doing surgery with Lurena Joiner and then she never heard anything back; the pt would like a CB to update her on what's going on?  [phone]

## 2020-04-19 NOTE — Telephone Encounter (Signed)
Discussed with Dr. Marlou Sa.  With the history and the nature of her problem, recommend she see a hand surgeon for further management.  I am happy to order a nerve study like we discussed but any operative management would probably require a hand surgeon.  I think she will be best served with referral.  This is a potentially complex problem that hand surgeons see more often than we do.

## 2020-04-20 NOTE — Telephone Encounter (Signed)
IC and spoke with patient at length. I described below. She is asking if Dr Marlou Sa could please call her to further discuss because she has a lot of concerns about going to see a hand specialist. She states she does not really want to go unless necessary. She is asking if Dr Marlou Sa is positive he could not help her in anyway. Dr Marlou Sa please call patient to discuss.

## 2020-04-22 ENCOUNTER — Encounter: Payer: Self-pay | Admitting: Orthopedic Surgery

## 2020-04-22 ENCOUNTER — Other Ambulatory Visit: Payer: Self-pay | Admitting: Surgical

## 2020-04-22 DIAGNOSIS — M5412 Radiculopathy, cervical region: Secondary | ICD-10-CM

## 2020-04-22 MED ORDER — METHOCARBAMOL 500 MG PO TABS: 500.0000 mg | ORAL_TABLET | Freq: Three times a day (TID) | ORAL | 0 refills | Status: AC | PRN

## 2020-04-22 MED ORDER — HYDROCODONE-ACETAMINOPHEN 7.5-325 MG PO TABS: 1.0000 | ORAL_TABLET | Freq: Four times a day (QID) | ORAL | 0 refills | Status: AC | PRN

## 2020-04-22 MED ORDER — GABAPENTIN 300 MG PO CAPS: ORAL_CAPSULE | ORAL | 0 refills | Status: DC

## 2020-04-22 NOTE — Telephone Encounter (Signed)
Hi Lauren.  I called Joelene Millin.  Meds refilled.  Also talked to [facility] who will see her either next Tuesday or Thursday.  If you could send their office her info and have their office call her for an appointment with [facility] on Tuesday or Thursday that would be great.  Thanks

## 2020-04-26 ENCOUNTER — Telehealth: Payer: Self-pay

## 2020-04-26 NOTE — Telephone Encounter (Signed)
Patient came in she is requesting a nerve conduction study done with Dr.Newton call [phone]

## 2020-04-26 NOTE — Telephone Encounter (Signed)
Patient called in wanting to ask questions regarding er recent visit.

## 2020-04-26 NOTE — Telephone Encounter (Signed)
I spoke with patient at length. She had an appointment with Dr Burney Gauze this morning. She states that he agreed that she did need surgery on her elbow but also had concerns about her hand and wants her to have an EMG/NCV? She states she is not sleeping, cannot function, has to use a heating pad all of the time and is very miserable. She seemed upset because Dr Jeral Fruit office first available for EMG/NCV is not until after Thanksgiving.  I did message Loma Sousa to see when Dr Newtons next available is.  I advised patient of this as well but she is expressing that she cannot continue to live/function like this and feels like it is a much more urgent problem/concern. Please advise.

## 2020-04-26 NOTE — Telephone Encounter (Signed)
Please advise. I do not see a referral for this.

## 2020-04-26 NOTE — Telephone Encounter (Signed)
We actually did not refer her to you guys as of yet. She was referred to Dr Burney Gauze by Dr Marlou Sa who recommended a EMG/NCV but they are unable to do the study for her until after Thanksgiving sometime. What is Dr Kennon Portela first available for EMG/NCV?

## 2020-04-26 NOTE — Telephone Encounter (Signed)
I would try neurology  for emg ncv thx

## 2020-04-26 NOTE — Telephone Encounter (Signed)
Right now it looks like 11/5.

## 2020-04-27 NOTE — Telephone Encounter (Signed)
After discussing with Dr Marlou Sa he recommended to follow up with Dr Burney Gauze because we were unsure specifically what he was looking for EMG/NCV. Spoke with patient at length and advised she verbalized understanding.

## 2020-04-27 NOTE — Telephone Encounter (Signed)
Patient asking for refill on pain medication, gabapentin and robaxin? She is asking if this is something you can help advise on

## 2020-04-27 NOTE — Telephone Encounter (Signed)
I can refill Gabapentin and Robaxin but I think it's too soon for refill on any pain medication.  She just got Norco 5 days ago

## 2020-04-28 ENCOUNTER — Telehealth: Payer: Self-pay | Admitting: Orthopedic Surgery

## 2020-04-28 ENCOUNTER — Encounter: Payer: Self-pay | Admitting: Orthopedic Surgery

## 2020-04-28 NOTE — Telephone Encounter (Signed)
IC sw patient and advised per below. She states she never received rx's submitted on 04/22/20.  She will check with pharmacy and let us know.

## 2020-04-28 NOTE — Telephone Encounter (Signed)
Pt would like to know what anesthesia was used her from her 02/15/20 surgery; pt would like a CB with the answer  [phone]

## 2020-04-28 NOTE — Telephone Encounter (Signed)
Please advise. Thanks.  

## 2020-04-28 NOTE — Telephone Encounter (Signed)
General and interscaline block

## 2020-04-28 NOTE — Telephone Encounter (Signed)
IC s/w patient and advised  

## 2020-05-04 ENCOUNTER — Encounter: Payer: Self-pay | Admitting: Surgical

## 2020-05-04 NOTE — Progress Notes (Signed)
Office Visit Note   Patient: Marie Richardson           Date of Birth: 1960-09-29           MRN: [id number] Visit Date: 04/15/2020 Requested by: Marie Salinas, MD [address] [place],  [place] [id number] PCP: Marie Salinas, MD  Subjective: Chief Complaint  Patient presents with  . Right Shoulder - Routine Post Op    HPI: Marie Richardson is a 59 y.o. female who presents to the office complaining of right elbow pain.  Patient notes significant right elbow pain that is located at the medial aspect of the right elbow with increased sensitivity to sensation.  She also has pain that travels down the ulnar aspect of her right forearm into her fourth and fifth fingers.  She has a history of ulnar nerve decompression by Dr. Jannet Richardson 2 and half years ago.  She notes that pain is keeping her up at night.  This pain is bothering her more than her previous right shoulder pain.  She had a nerve conduction study 1.5 years ago.  She does have a history of neck surgery with 2 oh fusion at C4-C5 and C5-C6.  She has gabapentin helps her pain.  She feels that this right elbow pain with radiation down her arm is hampering her progress with her right shoulder following right shoulder manipulation under anesthesia and rotator interval release.              ROS: All systems reviewed are negative as they relate to the chief complaint within the history of present illness.  Patient denies fevers or chills.  Assessment & Plan: Visit Diagnoses: No diagnosis found.  Plan: Patient is a 59 year old female presents complaining of primarily right elbow pain.  She has medial right elbow pain that travels down the ulnar aspect of her arm.  Denies any significant neck pain.  She has history of ulnar nerve decompression 2 and half years ago.  Last nerve conduction study was 1.5 years ago.  She has history of two-level fusion by Dr. Louanne Richardson.  On exam she has positive Froment's sign, weakness with  finger abduction, positive Tinel's sign over the elbow and over Guyon's canal.  Recommended nerve conduction study versus follow-up with hand surgeon.  Patient referred to Dr. Burney Richardson.  Follow-Up Instructions: No follow-ups on file.   Orders:  No orders of the defined types were placed in this encounter.  No orders of the defined types were placed in this encounter.     Procedures: No procedures performed   Clinical Data: No additional findings.  Objective: Vital Signs: There were no vitals taken for this visit.  Physical Exam:  Constitutional: Patient appears well-developed HEENT:  Head: Normocephalic Eyes:EOM are normal Neck: Normal range of motion Cardiovascular: Normal rate Pulmonary/chest: Effort normal Neurologic: Patient is alert Skin: Skin is warm Psychiatric: Patient has normal mood and affect  Ortho Exam: Ortho exam demonstrates no significant tenderness throughout the medial epicondyle, lateral epicondyle of the right elbow.  No tenderness over the bicep tendon with bicep tendon intact distally.  No olecranon bursitis.  Positive Tinel's sign over the ulnar nerve at the elbow.  Positive Tinel over Guyon's canal.  Weakness with finger abduction.  Positive Froment's sign negative Phalen's sign.  Negative Tinel over the carpal tunnel.  Negative Spurling sign.  No significant tenderness over the axial cervical spine or paraspinal musculature.  No weakness with other muscle groups in the right upper extremity.  Specialty Comments:  No specialty comments available.  Imaging: No results found.   PMFS History: Patient Active Problem List   Diagnosis Date Noted  . S/P cervical spinal fusion 06/20/2018  . Status post laminectomy 03/26/2017  . Other spondylosis with radiculopathy, cervical region 03/26/2017    Class: Temporary   Past Medical History:  Diagnosis Date  . Arthritis    neck  . Asthma   . Complication of anesthesia    pt told she should always have  a "pediatric intubation tube"  . 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 Endometriosis   . High cholesterol   . Pneumonia   . PONV (postoperative nausea and vomiting)     Family History  Problem Relation Age of Onset  . Heart attack Mother   . Arthritis Mother   . High Cholesterol Mother   . High Cholesterol Father   . Hypertension Father   . Heart attack Father     Past Surgical History:  Procedure Laterality Date  . ABDOMINAL HYSTERECTOMY  2000  . ANTERIOR CERVICAL DECOMP/DISCECTOMY FUSION N/A 06/20/2018   Procedure: ANTERIOR CERVICAL DISCECTOMY AND FUSION C4-5 AND C5-6 WITH PLATES, SCREWS, ALLOGRAFT BONE GRAFT AND LOCAL BONE GRAFT;  Surgeon: Marie Oto, MD;  Location: [facility];  Service: Orthopedics;  Laterality: N/A;  . APPENDECTOMY     secondary to endometriosis  . CARPAL TUNNEL RELEASE Right   . COLONOSCOPY    . LAPAROSCOPIC ENDOMETRIOSIS FULGURATION     pt had 7 surgeries related to endometriosis  . POSTERIOR CERVICAL FUSION/FORAMINOTOMY N/A 03/26/2017   Procedure: Right C5-6 and C6-7 Foraminotomies;  Surgeon: Marie Oto, MD;  Location: [facility];  Service: Orthopedics;  Laterality: N/A;  . ULNAR TUNNEL RELEASE Right 09/19/2017   Social History   Occupational History  . Not on file  Tobacco Use  . Smoking status: Never Smoker  . Smokeless tobacco: Never Used  Vaping Use  . Vaping Use: Never used  Substance and Sexual Activity  . Alcohol use: Yes    Comment: very rarely  . Drug use: No  . Sexual activity: Not on file

## 2020-05-30 ENCOUNTER — Other Ambulatory Visit: Payer: Self-pay | Admitting: Surgical

## 2020-05-30 DIAGNOSIS — M5412 Radiculopathy, cervical region: Secondary | ICD-10-CM

## 2020-05-30 MED ORDER — GABAPENTIN 300 MG PO CAPS: ORAL_CAPSULE | ORAL | 0 refills | Status: AC

## 2020-06-07 ENCOUNTER — Encounter: Payer: Commercial Managed Care - PPO | Admitting: Physical Medicine and Rehabilitation

## 2020-07-04 ENCOUNTER — Encounter: Payer: Self-pay | Admitting: Specialist

## 2020-07-05 ENCOUNTER — Telehealth: Payer: Self-pay | Admitting: Specialist

## 2020-07-05 NOTE — Telephone Encounter (Signed)
Pt called stating her father was friends with Dr.Nitka and has recently passed so she wanted to let him know; pt left a message in [place] for christy.

## 2020-09-05 ENCOUNTER — Ambulatory Visit: Payer: Commercial Managed Care - PPO | Admitting: Specialist

## 2020-09-28 ENCOUNTER — Other Ambulatory Visit: Payer: Self-pay

## 2020-09-28 ENCOUNTER — Ambulatory Visit: Payer: Self-pay

## 2020-09-28 ENCOUNTER — Encounter: Payer: Self-pay | Admitting: Specialist

## 2020-09-28 ENCOUNTER — Ambulatory Visit (INDEPENDENT_AMBULATORY_CARE_PROVIDER_SITE_OTHER): Payer: Commercial Managed Care - PPO | Admitting: Specialist

## 2020-09-28 VITALS — BP 134/87 | HR 99 | Ht 65.0 in | Wt 139.5 lb

## 2020-09-28 DIAGNOSIS — Z981 Arthrodesis status: Secondary | ICD-10-CM | POA: Diagnosis not present

## 2020-09-28 DIAGNOSIS — M7532 Calcific tendinitis of left shoulder: Secondary | ICD-10-CM

## 2020-09-28 DIAGNOSIS — M5412 Radiculopathy, cervical region: Secondary | ICD-10-CM | POA: Diagnosis not present

## 2020-09-28 MED ORDER — DICLOFENAC SODIUM 1 % EX GEL: 4.0000 g | Freq: Four times a day (QID) | CUTANEOUS | 3 refills | Status: AC

## 2020-09-28 MED ORDER — BUPIVACAINE HCL 0.5 % IJ SOLN
3.0000 mL | INTRAMUSCULAR | Status: AC | PRN
  Administered 2020-09-28: 3 mL via INTRA_ARTICULAR

## 2020-09-28 MED ORDER — METHYLPREDNISOLONE ACETATE 40 MG/ML IJ SUSP
40.0000 mg | INTRAMUSCULAR | Status: AC | PRN
  Administered 2020-09-28: 40 mg via INTRA_ARTICULAR

## 2020-09-28 NOTE — Progress Notes (Addendum)
Office Visit Note   Patient: Marie Richardson           Date of Birth: [birth year]           MRN: [id number] Visit Date: 09/28/2020              Requested by: Wayland Salinas, MD [address] [place],  [place] [address] PCP: Wayland Salinas, MD   Assessment & Plan: Visit Diagnoses:  1. Radiculopathy, cervical region   2. Calcific tendonitis of left shoulder region   60 year old female with left shoulder pain, MRI with tendonitis changes, plain radiograph from 12/2019 shows calcific Tendonitis of the left shoulder. She saw Dr. Marlou Sa and had right shoulder manipulation under anesthesia of the right shoulder last year and then was seen by Dr. Harl Favor and underwent a redo right elbow exploration of the ulna Nerve at the elbow and transposition. She has recovered from her right elbow surgery and does appear more comfortable clinically. 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 I gave her an injection today and started PT to be done in [place]. As she has been hurting for nearly one year I am not optimistic that she will see relief. Discussed the fact that her left arm is her better arm and she tends to overuse the arm.  Plan: Avoid overhead lifting and overhead use of the arms. Pillows to keep from sleeping directly on the shoulders Limited lifting to less than 10 lbs. Ice or heat for relief. NSAIDs are helpful, such as alleve or motrin,voltaren gel be careful not to use in excess as they place burdens on the kidney. Stretching exercise help and strengthening is helpful to build endurance.  Follow-Up Instructions: Return in about 4 weeks (around 10/26/2020).   Orders:  No orders of the defined types were placed in this encounter.  No orders of the defined types were placed in this encounter.     Procedures: Large Joint Inj: L subacromial bursa on 09/28/2020 6:00  PM Indications: pain Details: 25 G 1.5 in needle, posterior approach  Arthrogram: No  Medications: 40 mg methylPREDNISolone acetate 40 MG/ML; 3 mL bupivacaine 0.5 % Outcome: tolerated well, no immediate complications Procedure, treatment alternatives, risks and benefits explained, specific risks discussed. Consent was given by the patient. Immediately prior to procedure a time out was called to verify the correct patient, procedure, equipment, support staff and site/side marked as required. Patient was prepped and draped in the usual sterile fashion.       Clinical Data: Findings:  Narrative & Impression CLINICAL DATA:  Left shoulder and arm pain for the past year. No known injury. Proximal humerus mass.  EXAM: MRI OF THE LEFT SHOULDER WITHOUT AND WITH CONTRAST  TECHNIQUE: Multiplanar, multisequence MR imaging of the left shoulder was performed before and after the administration of intravenous contrast.  CONTRAST:  6.61mL GADAVIST GADOBUTROL 1 MMOL/ML IV SOLN  COMPARISON:  Left shoulder x-rays dated Dec 09, 2019.  FINDINGS: Rotator cuff: Mild supraspinatus tendinosis with tiny low-grade partial-thickness articular surface tear of the mid tendon. The infraspinatus, teres minor, and subscapularis tendons are unremarkable.  Muscles: No atrophy or abnormal signal of the muscles of the rotator cuff.  Biceps long head:  Intact and normally positioned.  Acromioclavicular Joint: Normal acromioclavicular joint. Type I acromion. No subacromial/subdeltoid bursal fluid.  Glenohumeral Joint: No joint effusion. No chondral defect.  Labrum: Grossly intact, but evaluation  is limited by lack of intraarticular fluid.  Bones: Within the proximal humeral metaphysis, there is a lobulated well-defined predominantly T2 hyperintense, T1 hypointense mass measuring 1.9 x 2.4 x 3.8 cm (AP by transverse by CC). There are foci of low T2 and T1 signal within the mass, as well as  several areas of normal T1 marrow signal. The mass demonstrates predominantly rim enhancement. There is no surrounding marrow edema or scalloping. No soft tissue mass. No acute fracture or dislocation.  Other: None.  IMPRESSION: 1. 3.8 cm non-aggressive appearing lesion in the proximal humeral metaphysis, with signal characteristics favoring a benign enchondroma. Given patient's symptoms, follow-up x-rays every 6-12 months should be considered to ensure the lesion does not grow. 2. Mild supraspinatus tendinosis with tiny low-grade partial-thickness articular surface tear of the mid tendon.   Electronically Signed   By: Titus Dubin M.D.   On: 02/07/2020 13:27   Review of the radiographs of the left shoulder from 12/2019 show an area of calcification seen in the soft tissue adjacent to the left shoulder greater tuberosity. This has the appearance of calcific tendonitis of the left shoulder rotator cuff.  Review of the report of the repeat MRI of the left shoulder from 07/2020 as the images are with [facility] and are not available for review. This study showed no change in the size of the lesion seen in the left shoulder proximal metaphysis, no full thickness cuff tear noted bu there is suggestion of a partial tear in the area of the left distal supraspinatous bursal side.     Subjective: Chief Complaint  Patient presents with  . Neck - Pain    60 year old right handed female post right shoulder manipulation and right elbow redo ulnar neurolysis and transposition of the right ulna n at the elbow. She has had 2 MRIs of the left shoulder. done one in July 2021 and one more recently at [facility] in December 2021. Assessing enchondroma in the neck. Complains of pain left shoulder pain with lying on the left shoulder.    Review of Systems  Constitutional: Positive for fever (COVID in Dec.2021). Negative for activity change, appetite change, chills, diaphoresis, fatigue  and unexpected weight change.  HENT: Positive for congestion, rhinorrhea, sinus pressure and sinus pain. Negative for dental problem, drooling, ear discharge, ear pain, facial swelling, hearing loss, nosebleeds, postnasal drip, sneezing, sore throat, tinnitus, trouble swallowing and voice change.   Eyes: Negative.  Negative for photophobia, pain, discharge, redness, itching and visual disturbance.  Respiratory: Negative.  Negative for apnea, cough, choking, chest tightness, shortness of breath, wheezing and stridor.   Cardiovascular: Negative.  Negative for chest pain, palpitations and leg swelling.  Gastrointestinal: Negative.  Negative for abdominal distention, abdominal pain, anal bleeding, blood in stool, constipation, diarrhea, nausea, rectal pain and vomiting.  Endocrine: Negative.  Negative for cold intolerance, heat intolerance, polydipsia, polyphagia and polyuria.  Genitourinary: Negative.  Negative for difficulty urinating, dyspareunia, dysuria, enuresis, flank pain, frequency, genital sores, hematuria and pelvic pain.  Musculoskeletal: Positive for arthralgias, neck pain and neck stiffness. Negative for back pain, gait problem, joint swelling and myalgias.  Skin: Negative.  Negative for color change, pallor, rash and wound.  Allergic/Immunologic: Negative.  Negative for environmental allergies, food allergies and immunocompromised state.  Neurological: Positive for weakness. Negative for dizziness, tremors, seizures, syncope, facial asymmetry, speech difficulty, light-headedness, numbness and headaches.  Hematological: Negative.   Psychiatric/Behavioral: Negative.  Negative for agitation, behavioral problems, confusion, decreased concentration, dysphoric mood,  hallucinations, self-injury, sleep disturbance and suicidal ideas. The patient is not nervous/anxious and is not hyperactive.      Objective: Vital Signs: BP 134/87 (BP Location: Left Arm, Patient Position: Sitting)   Pulse 99    Ht 5\' 5"  (1.651 m)   Wt 139 lb 8 oz (63.3 kg)   BMI 23.21 kg/m   Physical Exam Constitutional:      Appearance: She is well-developed and well-nourished.  HENT:     Head: Normocephalic and atraumatic.  Eyes:     Extraocular Movements: EOM normal.     Pupils: Pupils are equal, round, and reactive to light.  Pulmonary:     Effort: Pulmonary effort is normal.     Breath sounds: Normal breath sounds.  Abdominal:     General: Bowel sounds are normal.     Palpations: Abdomen is soft.  Musculoskeletal:     Cervical back: Normal range of motion and neck supple.  Skin:    General: Skin is warm and dry.  Neurological:     Mental Status: She is alert and oriented to person, place, and time.  Psychiatric:        Mood and Affect: Mood and affect normal.        Behavior: Behavior normal.        Thought Content: Thought content normal.        Judgment: Judgment normal.     Left Shoulder Exam   Tenderness  The patient is experiencing tenderness in the acromion.  Range of Motion  Active abduction: normal  Passive abduction: normal  Extension: normal  External rotation: normal  Forward flexion: normal  Internal rotation 0 degrees: abnormal  Internal rotation 90 degrees: abnormal   Muscle Strength  Abduction: 4/5  Internal rotation: 5/5  External rotation: 4/5  Supraspinatus: 4/5  Subscapularis: 5/5  Biceps: 5/5   Tests  Apprehension: negative Hawkins test: negative Cross arm: negative Impingement: positive Drop arm: negative Sulcus: absent  Other  Erythema: absent Scars: absent Sensation: normal Pulse: present   Comments:  Pain is mainly into the left supraspinatous and left lateral shoulder.      Specialty Comments:  No specialty comments available.  Imaging: No results found.   PMFS History: Patient Active Problem List   Diagnosis Date Noted  . Other spondylosis with radiculopathy, cervical region 03/26/2017    Priority: High    Class: Temporary   . S/P cervical spinal fusion 06/20/2018  . Status post laminectomy 03/26/2017   Past Medical History:  Diagnosis Date  . Arthritis    neck  . Asthma   . Complication of anesthesia    pt told she should always have a "pediatric intubation tube"  . 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 Endometriosis   . High cholesterol   . Pneumonia   . PONV (postoperative nausea and vomiting)     Family History  Problem Relation Age of Onset  . Heart attack Mother   . Arthritis Mother   . High Cholesterol Mother   . High Cholesterol Father   . Hypertension Father   . Heart attack Father     Past Surgical History:  Procedure Laterality Date  . ABDOMINAL HYSTERECTOMY  2000  . ANTERIOR CERVICAL DECOMP/DISCECTOMY FUSION N/A 06/20/2018   Procedure: ANTERIOR CERVICAL DISCECTOMY AND FUSION C4-5 AND C5-6 WITH PLATES, SCREWS, ALLOGRAFT BONE GRAFT AND LOCAL BONE GRAFT;  Surgeon: Jessy Oto, MD;  Location: [facility];  Service: Orthopedics;  Laterality: N/A;  .  APPENDECTOMY     secondary to endometriosis  . CARPAL TUNNEL RELEASE Right   . COLONOSCOPY    . LAPAROSCOPIC ENDOMETRIOSIS FULGURATION     pt had 7 surgeries related to endometriosis  . POSTERIOR CERVICAL FUSION/FORAMINOTOMY N/A 03/26/2017   Procedure: Right C5-6 and C6-7 Foraminotomies;  Surgeon: Jessy Oto, MD;  Location: [facility];  Service: Orthopedics;  Laterality: N/A;  . ULNAR TUNNEL RELEASE Right 09/19/2017   Social History   Occupational History  . Not on file  Tobacco Use  . Smoking status: Never Smoker  . Smokeless tobacco: Never Used  Vaping Use  . Vaping Use: Never used  Substance and Sexual Activity  . Alcohol use: Yes    Comment: very rarely  . Drug use: No  . Sexual activity: Not on file

## 2020-09-28 NOTE — Patient Instructions (Signed)
Avoid overhead lifting and overhead use of the arms. Pillows to keep from sleeping directly on the shoulders Limited lifting to less than 10 lbs. Ice or heat for relief. NSAIDs are helpful, such as alleve or motrin,voltaren gel be careful not to use in excess as they place burdens on the kidney. Stretching exercise help and strengthening is helpful to build endurance.

## 2020-09-29 ENCOUNTER — Encounter: Payer: Self-pay | Admitting: Specialist

## 2020-10-19 ENCOUNTER — Other Ambulatory Visit: Payer: Self-pay

## 2020-10-19 ENCOUNTER — Ambulatory Visit: Payer: Commercial Managed Care - PPO | Admitting: Orthopedic Surgery

## 2020-10-19 ENCOUNTER — Encounter: Payer: Self-pay | Admitting: Orthopedic Surgery

## 2020-10-19 ENCOUNTER — Ambulatory Visit: Payer: Self-pay

## 2020-10-19 DIAGNOSIS — M7532 Calcific tendinitis of left shoulder: Secondary | ICD-10-CM

## 2020-10-19 MED ORDER — OXYCODONE-ACETAMINOPHEN 5-325 MG PO TABS
1.0000 | ORAL_TABLET | Freq: Four times a day (QID) | ORAL | 0 refills | Status: AC | PRN
Start: 2020-10-19 — End: ?

## 2020-10-19 NOTE — Progress Notes (Signed)
Subjective: Patient is here for ultrasound-guided intra-articular left glenohumeral injection.    Objective:  Pain with overhead reach.  Procedure: Ultrasound guided injection is preferred based studies that show increased duration, increased effect, greater accuracy, decreased procedural pain, increased response rate, and decreased cost with ultrasound guided versus blind injection.   Verbal informed consent obtained.  Time-out conducted.  Noted no overlying erythema, induration, or other signs of local infection. Ultrasound-guided left glenohumeral injection: After sterile prep with Betadine, injected 4 cc 0.25% bupivocaine without epinephrine and 6 mg betamethasone using a 22-gauge spinal needle, passing the needle from posterior approach into the glenohumeral joint.  Injectate seen filling joint capsule.

## 2020-10-20 ENCOUNTER — Encounter: Payer: Self-pay | Admitting: Orthopedic Surgery

## 2020-10-20 NOTE — Progress Notes (Signed)
Office Visit Note   Patient: Marie Richardson           Date of Birth: 10-11-1960           MRN: [id number] Visit Date: 10/19/2020 Requested by: Wayland Salinas, MD [address] [place],  [place] [address] PCP: Wayland Salinas, MD  Subjective: Chief Complaint  Patient presents with  . Left Shoulder - Pain    HPI: Marie Richardson is a 60 year old patient with left shoulder pain.  She reports constant pain.  She uses both arms for ADLs.  The pain does wake her from sleep at night on the left-hand side.  She had a subacromial injection 2 weeks ago which did not give her any relief.  She has had MRI scans last year in July and December which showed 3.8 cm enchondroma within the humeral head and metaphysis.  No cortical encroachment or destruction.  She does report night pain as well as pain which is worsening.  Takes hydrocodone about 3 times a day.  Also Robaxin.  Denies much in the way of radicular symptoms in the arm.              ROS: All systems reviewed are negative as they relate to the chief complaint within the history of present illness.  Patient denies  fevers or chills.   Assessment & Plan: Visit Diagnoses:  1. Calcific tendonitis of left shoulder region     Plan: Impression is left shoulder pain.  Could be early frozen shoulder based on limitation of passive forward flexion.  Alternatively this could be pain from the enchondroma which would put it in a category of possible chondrosarcoma.  I would like for Dr. Junius Roads to do a diagnostic and therapeutic glenohumeral joint injection under ultrasound today.  4-week return for clinical recheck on effectiveness of that injection and decision for or against referral to orthopedic oncologist for more work-up.  Follow-Up Instructions: Return in about 4 weeks (around 11/16/2020).   Orders:  Orders Placed This Encounter  Procedures  . US Guided Needle Placement - No Linked Charges   Meds ordered this encounter   Medications  . oxyCODONE-acetaminophen (PERCOCET/ROXICET) 5-325 MG tablet    Sig: Take 1 tablet by mouth every 6 (six) hours as needed for severe pain.    Dispense:  15 tablet    Refill:  0      Procedures: No procedures performed   Clinical Data: No additional findings.  Objective: Vital Signs: There were no vitals taken for this visit.  Physical Exam:   Constitutional: Patient appears well-developed HEENT:  Head: Normocephalic Eyes:EOM are normal Neck: Normal range of motion Cardiovascular: Normal rate Pulmonary/chest: Effort normal Neurologic: Patient is alert Skin: Skin is warm Psychiatric: Patient has normal mood and affect    Ortho Exam: Ortho exam demonstrates full active and passive range of motion of the left elbow and wrist.  Passive range of motion on the left is 65/90/150.  Rotator cuff strength is intact.  No coarse grinding or crepitus with internal/external rotation of the shoulder.  Deltoid is functional.  Rotator cuff strength is excellent.  No lymphadenopathy in the shoulder girdle region  Specialty Comments:  No specialty comments available.  Imaging: No results found.   PMFS History: Patient Active Problem List   Diagnosis Date Noted  . S/P cervical spinal fusion 06/20/2018  . Status post laminectomy 03/26/2017  . Other spondylosis with radiculopathy, cervical region 03/26/2017    Class: Temporary   Past  Medical History:  Diagnosis Date  . Arthritis    neck  . Asthma   . Complication of anesthesia    pt told she should always have a "pediatric intubation tube"  . 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 Endometriosis   . High cholesterol   . Pneumonia   . PONV (postoperative nausea and vomiting)     Family History  Problem Relation Age of Onset  . Heart attack Mother   . Arthritis Mother   . High Cholesterol Mother   . High Cholesterol Father   . Hypertension Father   . Heart attack Father     Past Surgical  History:  Procedure Laterality Date  . ABDOMINAL HYSTERECTOMY  2000  . ANTERIOR CERVICAL DECOMP/DISCECTOMY FUSION N/A 06/20/2018   Procedure: ANTERIOR CERVICAL DISCECTOMY AND FUSION C4-5 AND C5-6 WITH PLATES, SCREWS, ALLOGRAFT BONE GRAFT AND LOCAL BONE GRAFT;  Surgeon: Jessy Oto, MD;  Location: [facility];  Service: Orthopedics;  Laterality: N/A;  . APPENDECTOMY     secondary to endometriosis  . CARPAL TUNNEL RELEASE Right   . COLONOSCOPY    . LAPAROSCOPIC ENDOMETRIOSIS FULGURATION     pt had 7 surgeries related to endometriosis  . POSTERIOR CERVICAL FUSION/FORAMINOTOMY N/A 03/26/2017   Procedure: Right C5-6 and C6-7 Foraminotomies;  Surgeon: Jessy Oto, MD;  Location: [facility];  Service: Orthopedics;  Laterality: N/A;  . ULNAR TUNNEL RELEASE Right 09/19/2017   Social History   Occupational History  . Not on file  Tobacco Use  . Smoking status: Never Smoker  . Smokeless tobacco: Never Used  Vaping Use  . Vaping Use: Never used  Substance and Sexual Activity  . Alcohol use: Yes    Comment: very rarely  . Drug use: No  . Sexual activity: Not on file

## 2020-11-02 ENCOUNTER — Ambulatory Visit: Payer: Commercial Managed Care - PPO | Admitting: Specialist

## 2020-11-09 ENCOUNTER — Ambulatory Visit: Payer: Commercial Managed Care - PPO | Admitting: Specialist

## 2020-11-16 ENCOUNTER — Other Ambulatory Visit: Payer: Self-pay

## 2020-11-16 ENCOUNTER — Ambulatory Visit: Payer: Commercial Managed Care - PPO | Admitting: Orthopedic Surgery

## 2020-11-16 ENCOUNTER — Ambulatory Visit: Payer: Self-pay

## 2020-11-16 DIAGNOSIS — M25512 Pain in left shoulder: Secondary | ICD-10-CM | POA: Diagnosis not present

## 2020-11-19 ENCOUNTER — Encounter: Payer: Self-pay | Admitting: Orthopedic Surgery

## 2020-11-19 NOTE — Progress Notes (Signed)
Office Visit Note   Patient: Marie Richardson           Date of Birth: [birth year]           MRN: [id number] Visit Date: 11/16/2020 Requested by: Marie Salinas, MD [address] [place],  [place] [id number] PCP: Marie Salinas, MD  Subjective: Chief Complaint  Patient presents with  . Left Shoulder - Follow-up    HPI: Marie Richardson is a patient with left shoulder pain.  She finished physical therapy.  Still is having a lot of pain.  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  Really no other structural problems noted in the shoulder to account for her pain.  When she is doing overhead motion is hard for her arm to come down.  Does have a history of subacromial injection which did not help.  She has pain predominantly at night as well.  Denies any radicular pain or neck pain.  Has a history of right frozen shoulder status postmanipulation and rotator interval release.  She did well with that.              ROS: All systems reviewed are negative as they relate to the chief complaint within the history of present illness.  Patient denies  fevers or chills.   Assessment & Plan: Visit Diagnoses:  1. Left shoulder pain, unspecified chronicity     Plan: Impression is left shoulder pain with no real definitive structural problem with the rotator cuff labrum or adhesive capsulitis.  She has had an injection and physical therapy.  I think we need to get an opinion from orthopedic oncology as to whether or not this enchondroma could be changing into a pain generating lesion within the proximal humerus.  We will send her to [facility] for evaluation either Dr. Redmond Pulling or Dr. Warren Lacy.  Follow-up with me as needed.  Follow-Up Instructions: No follow-ups on file.   Orders:  Orders Placed This Encounter  Procedures  . Ambulatory referral to Orthopedic Surgery   No orders of the defined types were placed in this encounter.      Procedures: No procedures performed   Clinical Data: No additional findings.  Objective: Vital Signs: There were no vitals taken for this visit.  Physical Exam:   Constitutional: Patient appears well-developed HEENT:  Head: Normocephalic Eyes:EOM are normal Neck: Normal range of motion Cardiovascular: Normal rate Pulmonary/chest: Effort normal Neurologic: Patient is alert Skin: Skin is warm Psychiatric: Patient has normal mood and affect    Ortho Exam: Ortho exam demonstrates good rotator cuff strength on the left infraspinatus supraspinatus and subscap muscle testing.  Patient has good cervical spine range of motion.  No AC joint tenderness to palpation on the left-hand side.  Shoulder range of motion is about 60 degrees of external rotation 15 degrees of abduction passively about 100 of abduction and 160 of forward flexion.  No coarse grinding or crepitus with active or passive range of motion of that left shoulder.  No masses lymphadenopathy or skin changes noted in the left shoulder girdle region.  Specialty Comments:  No specialty comments available.  Imaging: No results found.   PMFS History: Patient Active Problem List   Diagnosis Date Noted  . S/P cervical spinal fusion 06/20/2018  . Status post laminectomy 03/26/2017  . Other spondylosis with radiculopathy, cervical region 03/26/2017    Class: Temporary   Past Medical History:  Diagnosis Date  . Arthritis  neck  . Asthma   . Complication of anesthesia    pt told she should always have a "pediatric intubation tube"  . 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 Endometriosis   . High cholesterol   . Pneumonia   . PONV (postoperative nausea and vomiting)     Family History  Problem Relation Age of Onset  . Heart attack Mother   . Arthritis Mother   . High Cholesterol Mother   . High Cholesterol Father   . Hypertension Father   . Heart attack Father     Past Surgical History:   Procedure Laterality Date  . ABDOMINAL HYSTERECTOMY  2000  . ANTERIOR CERVICAL DECOMP/DISCECTOMY FUSION N/A 06/20/2018   Procedure: ANTERIOR CERVICAL DISCECTOMY AND FUSION C4-5 AND C5-6 WITH PLATES, SCREWS, ALLOGRAFT BONE GRAFT AND LOCAL BONE GRAFT;  Surgeon: Jessy Oto, MD;  Location: [facility];  Service: Orthopedics;  Laterality: N/A;  . APPENDECTOMY     secondary to endometriosis  . CARPAL TUNNEL RELEASE Right   . COLONOSCOPY    . LAPAROSCOPIC ENDOMETRIOSIS FULGURATION     pt had 7 surgeries related to endometriosis  . POSTERIOR CERVICAL FUSION/FORAMINOTOMY N/A 03/26/2017   Procedure: Right C5-6 and C6-7 Foraminotomies;  Surgeon: Jessy Oto, MD;  Location: [facility];  Service: Orthopedics;  Laterality: N/A;  . ULNAR TUNNEL RELEASE Right 09/19/2017   Social History   Occupational History  . Not on file  Tobacco Use  . Smoking status: Never Smoker  . Smokeless tobacco: Never Used  Vaping Use  . Vaping Use: Never used  Substance and Sexual Activity  . Alcohol use: Yes    Comment: very rarely  . Drug use: No  . Sexual activity: Not on file

## 2020-11-23 ENCOUNTER — Telehealth: Payer: Self-pay | Admitting: Orthopedic Surgery

## 2020-11-23 NOTE — Telephone Encounter (Signed)
Pt called stating she's noticed some swelling in the ball and socket area of her shoulder and she was wanting to know if Dr. Marlou Sa thinks this could be due to the mass in her lower arm? Pt would like a CB   [phone]

## 2020-11-23 NOTE — Telephone Encounter (Signed)
I called and left message on machine.  I do not think that the mass is causing the swelling in her shoulder joint region

## 2020-11-25 ENCOUNTER — Encounter: Payer: Self-pay | Admitting: Orthopedic Surgery

## 2020-11-25 NOTE — Telephone Encounter (Signed)
Heat ice and nsaids the way to go and try to keep it moving as much as possible

## 2020-12-09 ENCOUNTER — Ambulatory Visit: Payer: Commercial Managed Care - PPO | Admitting: Specialist

## 2020-12-27 ENCOUNTER — Other Ambulatory Visit: Payer: Self-pay | Admitting: Orthopedic Surgery

## 2020-12-27 DIAGNOSIS — M25512 Pain in left shoulder: Secondary | ICD-10-CM

## 2021-01-21 ENCOUNTER — Other Ambulatory Visit: Payer: Commercial Managed Care - PPO
